# Patient Record
Sex: Male | Born: 1970 | Race: Black or African American | Hispanic: No | Marital: Single | Smoking: Current every day smoker
Health system: Southern US, Community
[De-identification: ages and names within clinical notes are randomized; demographics above are authoritative.]

## PROBLEM LIST (undated history)

## (undated) DIAGNOSIS — F250 Schizoaffective disorder, bipolar type: Secondary | ICD-10-CM

## (undated) DIAGNOSIS — F419 Anxiety disorder, unspecified: Secondary | ICD-10-CM

## (undated) DIAGNOSIS — F259 Schizoaffective disorder, unspecified: Secondary | ICD-10-CM

## (undated) DIAGNOSIS — F1410 Cocaine abuse, uncomplicated: Secondary | ICD-10-CM

## (undated) DIAGNOSIS — F319 Bipolar disorder, unspecified: Secondary | ICD-10-CM

## (undated) DIAGNOSIS — F1210 Cannabis abuse, uncomplicated: Secondary | ICD-10-CM

## (undated) HISTORY — PX: HERNIA REPAIR: SHX51

---

## 2012-07-01 ENCOUNTER — Emergency Department: Payer: Self-pay | Admitting: Internal Medicine

## 2012-07-01 LAB — ETHANOL
Ethanol %: <0.003 % (ref 0.000–0.080)
Ethanol: <3 mg/dL

## 2012-07-01 LAB — URINALYSIS, COMPLETE
Bilirubin,UR: NEGATIVE
Glucose,UR: NEGATIVE mg/dL (ref 0–75)
Leukocyte Esterase: NEGATIVE
Nitrite: NEGATIVE
Ph: 6 (ref 4.5–8.0)
WBC UR: 1 /HPF (ref 0–5)

## 2012-07-01 LAB — COMPREHENSIVE METABOLIC PANEL
Albumin: 3.8 g/dL (ref 3.4–5.0)
Alkaline Phosphatase: 74 U/L (ref 50–136)
BUN: 9 mg/dL (ref 7–18)
Bilirubin,Total: 0.4 mg/dL (ref 0.2–1.0)
Calcium, Total: 8.8 mg/dL (ref 8.5–10.1)
Chloride: 108 mmol/L — ABNORMAL HIGH (ref 98–107)
Co2: 23 mmol/L (ref 21–32)
Creatinine: 0.85 mg/dL (ref 0.60–1.30)
EGFR (African American): >60
EGFR (Non-African Amer.): >60
Glucose: 88 mg/dL (ref 65–99)
Potassium: 4.1 mmol/L (ref 3.5–5.1)
SGOT(AST): 21 U/L (ref 15–37)
SGPT (ALT): 21 U/L (ref 12–78)
Sodium: 139 mmol/L (ref 136–145)
Total Protein: 7.4 g/dL (ref 6.4–8.2)

## 2012-07-01 LAB — CBC
HGB: 13.2 g/dL (ref 13.0–18.0)
MCV: 85 fL (ref 80–100)
Platelet: 151 10*3/uL (ref 150–440)
RBC: 4.65 10*6/uL (ref 4.40–5.90)

## 2012-07-01 LAB — DRUG SCREEN, URINE
Benzodiazepine, Ur Scrn: NEGATIVE (ref ?–200)
Cannabinoid 50 Ng, Ur ~~LOC~~: NEGATIVE (ref ?–50)
MDMA (Ecstasy)Ur Screen: NEGATIVE (ref ?–500)
Opiate, Ur Screen: NEGATIVE (ref ?–300)

## 2012-07-01 LAB — ACETAMINOPHEN LEVEL: Acetaminophen: <2 ug/mL

## 2013-05-09 ENCOUNTER — Emergency Department: Payer: Self-pay | Admitting: Emergency Medicine

## 2013-05-09 LAB — URINALYSIS, COMPLETE
Bacteria: NONE SEEN
Bilirubin,UR: NEGATIVE
Blood: NEGATIVE
Glucose,UR: NEGATIVE mg/dL (ref 0–75)
Hyaline Cast: 1
KETONE: NEGATIVE
Leukocyte Esterase: NEGATIVE
Nitrite: NEGATIVE
PROTEIN: NEGATIVE
Ph: 7 (ref 4.5–8.0)
Specific Gravity: 1.018 (ref 1.003–1.030)
Squamous Epithelial: <1
WBC UR: <1 /HPF (ref 0–5)

## 2013-05-09 LAB — CBC
HCT: 41 % (ref 40.0–52.0)
HGB: 13.3 g/dL (ref 13.0–18.0)
MCH: 28 pg (ref 26.0–34.0)
MCHC: 32.5 g/dL (ref 32.0–36.0)
MCV: 86 fL (ref 80–100)
Platelet: 161 10*3/uL (ref 150–440)
RBC: 4.77 10*6/uL (ref 4.40–5.90)
RDW: 13.4 % (ref 11.5–14.5)
WBC: 6.4 10*3/uL (ref 3.8–10.6)

## 2013-05-09 LAB — COMPREHENSIVE METABOLIC PANEL
ALT: 21 U/L (ref 12–78)
AST: 15 U/L (ref 15–37)
Albumin: 3.7 g/dL (ref 3.4–5.0)
Alkaline Phosphatase: 81 U/L
Anion Gap: 5 — ABNORMAL LOW (ref 7–16)
BUN: 9 mg/dL (ref 7–18)
Bilirubin,Total: 0.4 mg/dL (ref 0.2–1.0)
CALCIUM: 8.8 mg/dL (ref 8.5–10.1)
CHLORIDE: 111 mmol/L — AB (ref 98–107)
Co2: 24 mmol/L (ref 21–32)
Creatinine: 0.84 mg/dL (ref 0.60–1.30)
EGFR (African American): >60
EGFR (Non-African Amer.): >60
GLUCOSE: 84 mg/dL (ref 65–99)
OSMOLALITY: 277 (ref 275–301)
Potassium: 3.9 mmol/L (ref 3.5–5.1)
SODIUM: 140 mmol/L (ref 136–145)
TOTAL PROTEIN: 7.1 g/dL (ref 6.4–8.2)

## 2013-05-09 LAB — ETHANOL: Ethanol %: <0.003 % (ref 0.000–0.080)

## 2013-05-09 LAB — DRUG SCREEN, URINE
Amphetamines, Ur Screen: NEGATIVE (ref ?–1000)
BENZODIAZEPINE, UR SCRN: NEGATIVE (ref ?–200)
Barbiturates, Ur Screen: NEGATIVE (ref ?–200)
CANNABINOID 50 NG, UR ~~LOC~~: NEGATIVE (ref ?–50)
COCAINE METABOLITE, UR ~~LOC~~: NEGATIVE (ref ?–300)
MDMA (Ecstasy)Ur Screen: NEGATIVE (ref ?–500)
Methadone, Ur Screen: NEGATIVE (ref ?–300)
Opiate, Ur Screen: NEGATIVE (ref ?–300)
PHENCYCLIDINE (PCP) UR S: NEGATIVE (ref ?–25)
Tricyclic, Ur Screen: NEGATIVE (ref ?–1000)

## 2013-05-09 LAB — ACETAMINOPHEN LEVEL: Acetaminophen: <2 ug/mL

## 2013-05-09 LAB — SALICYLATE LEVEL: Salicylates, Serum: 3.4 mg/dL — ABNORMAL HIGH

## 2013-05-09 LAB — VALPROIC ACID LEVEL: Valproic Acid: 9 ug/mL — ABNORMAL LOW

## 2014-05-16 NOTE — Consult Note (Signed)
Chief Complaint:  Chief Complaint Mr. Kevin Dunlap called police when per Ms. 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   He usually tries to call to get in the hospital  and has to be watched to prevent that.  He reports to me that he needs to go to the hospital but can't say why.   Presenting Symptoms:  Presenting Symptoms Apathy/Lethargy   Additional Symptoms Mr. Kevin Dunlap is avoidant on interview, Keeping a blanket over his head.  He is not spontaneous and is difficult to understand answering with one word or not answering at all.  He denies suicidal or homocidal ideation intent or plan.   History of Present Illness:  History of Present Illness He denies, says he doesn't want to talk aboutit but does report that he wants to go back to [place]. No family involvement.  DSS involvment. Called police told them he was angry and needed to go to the hospital.   Precipitating Event & Recent Stressors:  Precipitating Event & Recent Stressors Financial Problems  Employment Problems   Precipitating Event & Recent Stressors See above.  Tells me he needs to go to [facility] and get his Invega Injection.  Last injection Invega Sustenna 234 mg IM 06/07/2012. Next due o6/15/2014.   Target Symptoms:  Manic Impaired Judgment  Impulsivity   Anxiety Phobias   Arousal/Cognitive Agitation   Past Psychiatric Treatment: First Treatment: Does not answer.   Previous Hospitalizations: Does not answer.   Current [facility] [facility], [place]   Substance Abuse Treatment History: Does not answer.   Current Psychotropic Medications: Invega Sustenna 234 mg IM (last dose 06/07/2012, next due 07/08/2012.  Substance Abuse- Tobacco Use: Tobacco Use: Yes.  Past Medical & Surgical History:  Past Medical/Surgical Hx Does not answer   Past Medical/Surgical Hx Does not answer.   Family History: The patient denies any history of mental illness in the  family.. Does not answer.  Social History: Denies family involvment.  History of Trauma or Abuse: The patient denies any history of previous physical or sexual abuse.. No answer.  Legal History: The patient denies any history of arrests or previous incarcerations.Gustavus Bryant. [place].  Mental Status Exam:  Speech Monotone  Soft   Affect Constricted   Orientation Self  Place   Concentration Can't judge   Judgement Poor   Insight Poor   Reliabiity Poor   Suicide Risk Assessment: Suicide Risk Level No risk inidicated.  Electronic Signatures: Maryan PulsGreason, Dannis Deroche C (MD)  (Signed 09-Jun-14 16:12)  Authored: Chief Complaint, Presenting Symptoms, History of Present Illness, Precipitating Event & Recent Stressors, Target Symptoms, Past Psychiatric Treatment, Substance Abuse History, Past Medical & Surgical History, Family History, Social History, History of Trauma or Abuse, Legal History, Mental Status Exam, Suicide Risk Assessment   Last Updated: 09-Jun-14 16:12 by Maryan PulsGreason, Kie Calvin C (MD)

## 2014-05-16 NOTE — Consult Note (Signed)
Brief Consult Note: Diagnosis: Schizophrenia, PT.   Patient was seen by consultant.   Recommend further assessment or treatment.   Comments: [facility]. He stated that he is doing better now and wants to be discharged back to his group home. He follows Dr Omelia BlackwaterHeaden and he will get his Hinda Glatternvega shot on the 6/15. He is not having any perceptual disturbances and he denied SI/HI or plans. He was able to participate well in the interview. Has no acute issues at this time.   Plan: Pt will be discharged in stable condition.  He has supply of meds and no d/c meds will be given.  Follow up with Dr Omelia BlackwaterHeaden.  Electronic Signatures: Rhunette CroftFaheem, Chadwick Reiswig S (MD)  (Signed 10-Jun-14 10:35)  Authored: Brief Consult Note   Last Updated: 10-Jun-14 10:35 by Rhunette CroftFaheem, Prince Couey S (MD)

## 2014-07-27 ENCOUNTER — Encounter: Payer: Self-pay | Admitting: Emergency Medicine

## 2014-07-27 ENCOUNTER — Emergency Department
Admission: EM | Admit: 2014-07-27 | Discharge: 2014-07-27 | Disposition: A | Discharge status: Home / Self Care | Payer: Medicaid Other | Attending: Emergency Medicine | Admitting: Emergency Medicine

## 2014-07-27 DIAGNOSIS — Y998 Other external cause status: Secondary | ICD-10-CM | POA: Diagnosis not present

## 2014-07-27 DIAGNOSIS — Y92410 Unspecified street and highway as the place of occurrence of the external cause: Secondary | ICD-10-CM | POA: Diagnosis not present

## 2014-07-27 DIAGNOSIS — G8929 Other chronic pain: Secondary | ICD-10-CM | POA: Insufficient documentation

## 2014-07-27 DIAGNOSIS — Z041 Encounter for examination and observation following transport accident: Secondary | ICD-10-CM | POA: Insufficient documentation

## 2014-07-27 DIAGNOSIS — Y9389 Activity, other specified: Secondary | ICD-10-CM | POA: Insufficient documentation

## 2014-07-27 DIAGNOSIS — Z720 Tobacco use: Secondary | ICD-10-CM | POA: Diagnosis not present

## 2014-07-27 HISTORY — DX: Schizoaffective disorder, unspecified: F25.9

## 2014-07-27 HISTORY — DX: Bipolar disorder, unspecified: F31.9

## 2014-07-27 HISTORY — DX: Anxiety disorder, unspecified: F41.9

## 2014-07-27 HISTORY — DX: Schizoaffective disorder, bipolar type: F25.0
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
## 2015-06-21 ENCOUNTER — Emergency Department: Payer: Medicaid Other

## 2015-06-21 ENCOUNTER — Emergency Department
Admission: EM | Admit: 2015-06-21 | Discharge: 2015-06-21 | Disposition: A | Discharge status: Home / Self Care | Payer: Medicaid Other | Attending: Emergency Medicine | Admitting: Emergency Medicine

## 2015-06-21 DIAGNOSIS — Y939 Activity, unspecified: Secondary | ICD-10-CM | POA: Insufficient documentation

## 2015-06-21 DIAGNOSIS — F17210 Nicotine dependence, cigarettes, uncomplicated: Secondary | ICD-10-CM | POA: Diagnosis not present

## 2015-06-21 DIAGNOSIS — W1839XA Other fall on same level, initial encounter: Secondary | ICD-10-CM | POA: Insufficient documentation

## 2015-06-21 DIAGNOSIS — F319 Bipolar disorder, unspecified: Secondary | ICD-10-CM | POA: Diagnosis not present

## 2015-06-21 DIAGNOSIS — S56902A Unspecified injury of unspecified muscles, fascia and tendons at forearm level, left arm, initial encounter: Secondary | ICD-10-CM | POA: Diagnosis present

## 2015-06-21 DIAGNOSIS — S42202A Unspecified fracture of upper end of left humerus, initial encounter for closed fracture: Secondary | ICD-10-CM | POA: Insufficient documentation

## 2015-06-21 DIAGNOSIS — F259 Schizoaffective disorder, unspecified: Secondary | ICD-10-CM | POA: Insufficient documentation

## 2015-06-21 DIAGNOSIS — Y999 Unspecified external cause status: Secondary | ICD-10-CM | POA: Diagnosis not present

## 2015-06-21 DIAGNOSIS — Y92091 Bathroom in other non-institutional residence as the place of occurrence of the external cause: Secondary | ICD-10-CM | POA: Diagnosis not present

## 2015-06-21 DIAGNOSIS — S42302A Unspecified fracture of shaft of humerus, left arm, initial encounter for closed fracture: Secondary | ICD-10-CM

## 2015-06-21 MED ORDER — ONDANSETRON HCL 4 MG/2ML IJ SOLN
INTRAMUSCULAR | Status: AC
Start: 2015-06-21 — End: 2015-06-21
  Administered 2015-06-21: 4 mg via INTRAVENOUS
  Filled 2015-06-21: qty 2

## 2015-06-21 MED ORDER — MORPHINE SULFATE (PF) 4 MG/ML IV SOLN
4.0000 mg | Freq: Once | INTRAVENOUS | Status: AC
  Administered 2015-06-21: 4 mg via INTRAVENOUS

## 2015-06-21 MED ORDER — HYDROMORPHONE HCL 1 MG/ML IJ SOLN
INTRAMUSCULAR | Status: AC
  Administered 2015-06-21: 1 mg via INTRAVENOUS
  Filled 2015-06-21: qty 1

## 2015-06-21 MED ORDER — HYDROMORPHONE HCL 1 MG/ML IJ SOLN
1.0000 mg | Freq: Once | INTRAMUSCULAR | Status: AC
  Administered 2015-06-21: 1 mg via INTRAVENOUS

## 2015-06-21 MED ORDER — ONDANSETRON HCL 4 MG/2ML IJ SOLN
4.0000 mg | Freq: Once | INTRAMUSCULAR | Status: AC
  Administered 2015-06-21: 4 mg via INTRAVENOUS

## 2015-06-21 MED ORDER — IBUPROFEN 800 MG PO TABS: 800.0000 mg | ORAL_TABLET | Freq: Three times a day (TID) | ORAL | Status: AC | PRN

## 2015-06-21 MED ORDER — MORPHINE SULFATE (PF) 4 MG/ML IV SOLN
INTRAVENOUS | Status: AC
  Administered 2015-06-21: 4 mg via INTRAVENOUS
  Filled 2015-06-21: qty 1

## 2015-06-21 MED ORDER — OXYCODONE-ACETAMINOPHEN 10-325 MG PO TABS: 1.0000 | ORAL_TABLET | ORAL | Status: DC | PRN
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
## 2015-06-21 NOTE — ED Notes (Signed)
Discharge instructions reviewed with patient and representative from group home. Patient and representative from group home verbalized understanding. Patient taken to lobby via wheelchair by ED tech.
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
## 2015-06-21 NOTE — Discharge Instructions (Signed)
1. Take pain medicines as needed (Motrin/Percocet). 2. Keep splint clean & dry. Wear sling as needed for comfort. 3. Return to the ER for worsening symptoms, numbness/tingling, increased swelling, discoloration of fingers or other concerns.  Humerus Fracture Treated With Immobilization The humerus is the large bone in your upper arm. You have a broken (fractured) humerus. These fractures are easily diagnosed with X-rays. TREATMENT  Simple fractures which will heal without disability are treated with simple immobilization. Immobilization means you will wear a cast, splint, or sling. You have a fracture which will do well with immobilization. The fracture will heal well simply by being held in a good position until it is stable enough to begin range of motion exercises. Do not take part in activities which would further injure your arm.  HOME CARE INSTRUCTIONS   Put ice on the injured area.  Put ice in a plastic bag.  Place a towel between your skin and the bag.  Leave the ice on for 15-20 minutes, 03-04 times a day.  If you have a cast:  Do not scratch the skin under the cast using sharp or pointed objects.  Check the skin around the cast every day. You may put lotion on any red or sore areas.  Keep your cast dry and clean.  If you have a splint:  Wear the splint as directed.  Keep your splint dry and clean.  You may loosen the elastic around the splint if your fingers become numb, tingle, or turn cold or blue.  If you have a sling:  Wear the sling as directed.  Do not put pressure on any part of your cast or splint until it is fully hardened.  Your cast or splint can be protected during bathing with a plastic bag. Do not lower the cast or splint into water.  Only take over-the-counter or prescription medicines for pain, discomfort, or fever as directed by your caregiver.  Do range of motion exercises as instructed by your caregiver.  Follow up as directed by your  caregiver. This is very important in order to avoid permanent injury or disability and chronic pain. SEEK IMMEDIATE MEDICAL CARE IF:   Your skin or nails in the injured arm turn blue or gray.  Your arm feels cold or numb.  You develop severe pain in the injured arm.  You are having problems with the medicines you were given. MAKE SURE YOU:   Understand these instructions.  Will watch your condition.  Will get help right away if you are not doing well or get worse.   This information is not intended to replace advice given to you by your health care provider. Make sure you discuss any questions you have with your health care provider.   Document Released: 04/18/2000 Document Revised: 01/31/2014 Document Reviewed: 06/04/2014 Elsevier Interactive Patient Education 2016 [employer]  Cast or Splint Care Casts and splints support injured limbs and keep bones from moving while they heal. It is important to care for your cast or splint at home.  HOME CARE INSTRUCTIONS  Keep the cast or splint uncovered during the drying period. It can take 24 to 48 hours to dry if it is made of plaster. A fiberglass cast will dry in less than 1 hour.  Do not rest the cast on anything harder than a pillow for the first 24 hours.  Do not put weight on your injured limb or apply pressure to the cast until your health care provider gives you permission.  Keep the cast or splint dry. Wet casts or splints can lose their shape and may not support the limb as well. A wet cast that has lost its shape can also create harmful pressure on your skin when it dries. Also, wet skin can become infected.  Cover the cast or splint with a plastic bag when bathing or when out in the rain or snow. If the cast is on the trunk of the body, take sponge baths until the cast is removed.  If your cast does become wet, dry it with a towel or a blow dryer on the cool setting only.  Keep your cast or splint clean. Soiled casts  may be wiped with a moistened cloth.  Do not place any hard or soft foreign objects under your cast or splint, such as cotton, toilet paper, lotion, or powder.  Do not try to scratch the skin under the cast with any object. The object could get stuck inside the cast. Also, scratching could lead to an infection. If itching is a problem, use a blow dryer on a cool setting to relieve discomfort.  Do not trim or cut your cast or remove padding from inside of it.  Exercise all joints next to the injury that are not immobilized by the cast or splint. For example, if you have a long leg cast, exercise the hip joint and toes. If you have an arm cast or splint, exercise the shoulder, elbow, thumb, and fingers.  Elevate your injured arm or leg on 1 or 2 pillows for the first 1 to 3 days to decrease swelling and pain.It is best if you can comfortably elevate your cast so it is higher than your heart. SEEK MEDICAL CARE IF:   Your cast or splint cracks.  Your cast or splint is too tight or too loose.  You have unbearable itching inside the cast.  Your cast becomes wet or develops a soft spot or area.  You have a bad smell coming from inside your cast.  You get an object stuck under your cast.  Your skin around the cast becomes red or raw.  You have new pain or worsening pain after the cast has been applied. SEEK IMMEDIATE MEDICAL CARE IF:   You have fluid leaking through the cast.  You are unable to move your fingers or toes.  You have discolored (blue or white), cool, painful, or very swollen fingers or toes beyond the cast.  You have tingling or numbness around the injured area.  You have severe pain or pressure under the cast.  You have any difficulty with your breathing or have shortness of breath.  You have chest pain.   This information is not intended to replace advice given to you by your health care provider. Make sure you discuss any questions you have with your health care  provider.   Document Released: 01/08/2000 Document Revised: 10/31/2012 Document Reviewed: 07/19/2012 Elsevier Interactive Patient Education [employer]

## 2015-06-29 ENCOUNTER — Other Ambulatory Visit: Payer: Medicaid Other

## 2015-06-29 ENCOUNTER — Encounter: Payer: Self-pay | Admitting: *Deleted

## 2015-06-29 NOTE — Patient Instructions (Signed)
  Your procedure is scheduled on: 06-30-15 Report to [facility] SAME [facility] 2ND FLOOR @ 9:30 AM-Kevin Dunlap NOTIFIED OF TIME DURING PHONE INTERVIEW   Remember: Instructions that are not followed completely may result in serious medical risk, up to and including death, or upon the discretion of your surgeon and anesthesiologist your surgery may need to be rescheduled.    _X___ 1. Do not eat food or drink liquids after midnight. No gum chewing or hard candies.     ____ 2. No Alcohol for 24 hours before or after surgery.   ____ 3. Bring all medications with you on the day of surgery if instructed.    _X___ 4. Notify your doctor if there is any change in your medical condition     (cold, fever, infections).     Do not wear jewelry, make-up, hairpins, clips or nail polish.  Do not wear lotions, powders, or perfumes. You may wear deodorant.  Do not shave 48 hours prior to surgery. Men may shave face and neck.  Do not bring valuables to the hospital.    [facility] belongings or valuables.               Contacts, dentures or bridgework may not be worn into surgery.  Leave your suitcase in the car. After surgery it may be brought to your room.  For patients admitted to the hospital, discharge time is determined by your treatment team.   Patients discharged the day of surgery will not be allowed to drive home.   Please read over the following fact sheets that you were given:     _X___ Take these medicines the morning of surgery with A SIP OF WATER:    1. DEPAKOTE  2. FLUPHENAZINE  3. MAY TAKE PERCOCET AM OF SURGERY IF NEEDED  4.  5.  6.  ____ Fleet Enema (as directed)   ____ Use CHG Soap as directed  ____ Use inhalers on the day of surgery  ____ Stop metformin 2 days prior to surgery    ____ Take 1/2 of usual insulin dose the night before surgery and none on the morning of surgery.   ____ Stop Coumadin/Plavix/aspirin-N/A  _X___ Stop  Anti-inflammatories-STOP IBUPROFEN NOW-PERCOCET OK TO CONTINUE   ____ Stop supplements until after surgery.    ____ Bring C-Pap to the hospital.

## 2015-06-30 ENCOUNTER — Ambulatory Visit: Payer: Medicaid Other

## 2015-06-30 ENCOUNTER — Encounter
Admission: RE | Disposition: A | Discharge status: Home / Self Care | Payer: Self-pay | Source: Ambulatory Visit | Attending: Surgery

## 2015-06-30 ENCOUNTER — Encounter: Payer: Self-pay | Admitting: Anesthesiology

## 2015-06-30 ENCOUNTER — Ambulatory Visit: Payer: Medicaid Other | Admitting: Anesthesiology

## 2015-06-30 ENCOUNTER — Ambulatory Visit
Admission: RE | Admit: 2015-06-30 | Discharge: 2015-06-30 | Disposition: A | Discharge status: Home / Self Care | Payer: Medicaid Other | Source: Ambulatory Visit | Attending: Surgery | Admitting: Surgery

## 2015-06-30 DIAGNOSIS — J45909 Unspecified asthma, uncomplicated: Secondary | ICD-10-CM | POA: Diagnosis not present

## 2015-06-30 DIAGNOSIS — S42352A Displaced comminuted fracture of shaft of humerus, left arm, initial encounter for closed fracture: Secondary | ICD-10-CM | POA: Diagnosis not present

## 2015-06-30 DIAGNOSIS — F319 Bipolar disorder, unspecified: Secondary | ICD-10-CM | POA: Diagnosis not present

## 2015-06-30 DIAGNOSIS — F259 Schizoaffective disorder, unspecified: Secondary | ICD-10-CM | POA: Insufficient documentation

## 2015-06-30 DIAGNOSIS — G40909 Epilepsy, unspecified, not intractable, without status epilepticus: Secondary | ICD-10-CM | POA: Diagnosis not present

## 2015-06-30 DIAGNOSIS — Z8249 Family history of ischemic heart disease and other diseases of the circulatory system: Secondary | ICD-10-CM | POA: Diagnosis not present

## 2015-06-30 DIAGNOSIS — F419 Anxiety disorder, unspecified: Secondary | ICD-10-CM | POA: Diagnosis not present

## 2015-06-30 DIAGNOSIS — F1410 Cocaine abuse, uncomplicated: Secondary | ICD-10-CM | POA: Diagnosis not present

## 2015-06-30 DIAGNOSIS — F17210 Nicotine dependence, cigarettes, uncomplicated: Secondary | ICD-10-CM | POA: Insufficient documentation

## 2015-06-30 DIAGNOSIS — Z419 Encounter for procedure for purposes other than remedying health state, unspecified: Secondary | ICD-10-CM

## 2015-06-30 DIAGNOSIS — Z79899 Other long term (current) drug therapy: Secondary | ICD-10-CM | POA: Diagnosis not present

## 2015-06-30 DIAGNOSIS — F1210 Cannabis abuse, uncomplicated: Secondary | ICD-10-CM | POA: Insufficient documentation

## 2015-06-30 DIAGNOSIS — Z833 Family history of diabetes mellitus: Secondary | ICD-10-CM | POA: Insufficient documentation

## 2015-06-30 HISTORY — DX: Cocaine abuse, uncomplicated: F14.10

## 2015-06-30 HISTORY — DX: Cannabis abuse, uncomplicated: F12.10

## 2015-06-30 HISTORY — PX: HUMERUS IM NAIL: SHX1769

## 2015-06-30 LAB — URINE DRUG SCREEN, QUALITATIVE (ARMC ONLY)
AMPHETAMINES, UR SCREEN: NOT DETECTED
BENZODIAZEPINE, UR SCRN: NOT DETECTED
Barbiturates, Ur Screen: NOT DETECTED
Cannabinoid 50 Ng, Ur ~~LOC~~: NOT DETECTED
Cocaine Metabolite,Ur ~~LOC~~: NOT DETECTED
MDMA (ECSTASY) UR SCREEN: NOT DETECTED
METHADONE SCREEN, URINE: NOT DETECTED
Opiate, Ur Screen: NOT DETECTED
PHENCYCLIDINE (PCP) UR S: NOT DETECTED
TRICYCLIC, UR SCREEN: NOT DETECTED

## 2015-06-30 SURGERY — INSERTION, INTRAMEDULLARY ROD, HUMERUS
Anesthesia: General | Site: Arm Upper | Laterality: Left | Wound class: Clean

## 2015-06-30 MED ORDER — CEFAZOLIN SODIUM-DEXTROSE 2-4 GM/100ML-% IV SOLN
2.0000 g | Freq: Once | INTRAVENOUS | Status: AC
  Administered 2015-06-30: 2 g via INTRAVENOUS

## 2015-06-30 MED ORDER — BUPIVACAINE-EPINEPHRINE (PF) 0.5% -1:200000 IJ SOLN
INTRAMUSCULAR | Status: AC
  Filled 2015-06-30: qty 30

## 2015-06-30 MED ORDER — FENTANYL CITRATE (PF) 100 MCG/2ML IJ SOLN
25.0000 ug | INTRAMUSCULAR | Status: DC | PRN
  Administered 2015-06-30: 25 ug via INTRAVENOUS
  Administered 2015-06-30: 50 ug via INTRAVENOUS
  Administered 2015-06-30 (×3): 25 ug via INTRAVENOUS

## 2015-06-30 MED ORDER — NEOMYCIN-POLYMYXIN B GU 40-200000 IR SOLN
Status: AC
  Filled 2015-06-30: qty 4

## 2015-06-30 MED ORDER — NEOSTIGMINE METHYLSULFATE 10 MG/10ML IV SOLN
INTRAVENOUS | Status: DC | PRN
  Administered 2015-06-30: 2 mg via INTRAVENOUS

## 2015-06-30 MED ORDER — OXYCODONE HCL 5 MG PO TABS: 5.0000 mg | ORAL_TABLET | Freq: Once | ORAL | Status: DC | PRN

## 2015-06-30 MED ORDER — KETOROLAC TROMETHAMINE 30 MG/ML IJ SOLN
INTRAMUSCULAR | Status: DC | PRN
  Administered 2015-06-30: 30 mg via INTRAVENOUS

## 2015-06-30 MED ORDER — FENTANYL CITRATE (PF) 100 MCG/2ML IJ SOLN
INTRAMUSCULAR | Status: AC
  Administered 2015-06-30: 25 ug via INTRAVENOUS
  Filled 2015-06-30: qty 2

## 2015-06-30 MED ORDER — LIDOCAINE HCL (CARDIAC) 20 MG/ML IV SOLN
INTRAVENOUS | Status: DC | PRN
  Administered 2015-06-30: 100 mg via INTRAVENOUS

## 2015-06-30 MED ORDER — ONDANSETRON HCL 4 MG/2ML IJ SOLN
4.0000 mg | Freq: Once | INTRAMUSCULAR | Status: AC
  Administered 2015-06-30: 4 mg via INTRAVENOUS

## 2015-06-30 MED ORDER — ROCURONIUM BROMIDE 100 MG/10ML IV SOLN
INTRAVENOUS | Status: DC | PRN
  Administered 2015-06-30: 50 mg via INTRAVENOUS

## 2015-06-30 MED ORDER — LACTATED RINGERS IV SOLN
INTRAVENOUS | Status: DC
  Administered 2015-06-30: 11:00:00 via INTRAVENOUS

## 2015-06-30 MED ORDER — ONDANSETRON HCL 4 MG/2ML IJ SOLN
INTRAMUSCULAR | Status: DC | PRN
  Administered 2015-06-30: 4 mg via INTRAVENOUS

## 2015-06-30 MED ORDER — OXYCODONE HCL 5 MG PO TABS: 5.0000 mg | ORAL_TABLET | ORAL | Status: DC | PRN

## 2015-06-30 MED ORDER — ONDANSETRON HCL 4 MG/2ML IJ SOLN
INTRAMUSCULAR | Status: AC
  Administered 2015-06-30: 4 mg via INTRAVENOUS
  Filled 2015-06-30: qty 2

## 2015-06-30 MED ORDER — OXYCODONE HCL 5 MG/5ML PO SOLN: 5.0000 mg | Freq: Once | ORAL | Status: DC | PRN

## 2015-06-30 MED ORDER — PROPOFOL 10 MG/ML IV BOLUS
INTRAVENOUS | Status: DC | PRN
Start: 2015-06-30 — End: 2015-06-30
  Administered 2015-06-30: 150 mg via INTRAVENOUS

## 2015-06-30 MED ORDER — FAMOTIDINE 20 MG PO TABS
20.0000 mg | ORAL_TABLET | Freq: Once | ORAL | Status: AC
  Administered 2015-06-30: 20 mg via ORAL

## 2015-06-30 MED ORDER — HYDROMORPHONE HCL 1 MG/ML IJ SOLN
INTRAMUSCULAR | Status: AC
  Administered 2015-06-30: 0.5 mg via INTRAVENOUS
  Filled 2015-06-30: qty 1

## 2015-06-30 MED ORDER — HYDROMORPHONE HCL 1 MG/ML IJ SOLN
0.5000 mg | INTRAMUSCULAR | Status: DC | PRN
  Administered 2015-06-30 (×2): 0.5 mg via INTRAVENOUS

## 2015-06-30 MED ORDER — FENTANYL CITRATE (PF) 100 MCG/2ML IJ SOLN
INTRAMUSCULAR | Status: AC
  Administered 2015-06-30: 50 ug via INTRAVENOUS
  Filled 2015-06-30: qty 2

## 2015-06-30 MED ORDER — CEFAZOLIN SODIUM-DEXTROSE 2-4 GM/100ML-% IV SOLN
INTRAVENOUS | Status: AC
  Filled 2015-06-30: qty 100

## 2015-06-30 MED ORDER — DEXAMETHASONE SODIUM PHOSPHATE 10 MG/ML IJ SOLN
INTRAMUSCULAR | Status: DC | PRN
  Administered 2015-06-30: 10 mg via INTRAVENOUS

## 2015-06-30 MED ORDER — FAMOTIDINE 20 MG PO TABS
ORAL_TABLET | ORAL | Status: AC
  Administered 2015-06-30: 20 mg via ORAL
  Filled 2015-06-30: qty 1

## 2015-06-30 MED ORDER — SODIUM CHLORIDE 0.9 % IR SOLN
Status: DC | PRN
  Administered 2015-06-30: 1000 mL

## 2015-06-30 MED ORDER — FENTANYL CITRATE (PF) 100 MCG/2ML IJ SOLN
INTRAMUSCULAR | Status: DC | PRN
  Administered 2015-06-30: 25 ug via INTRAVENOUS
  Administered 2015-06-30: 50 ug via INTRAVENOUS
  Administered 2015-06-30: 25 ug via INTRAVENOUS
  Administered 2015-06-30 (×2): 100 ug via INTRAVENOUS

## 2015-06-30 MED ORDER — GLYCOPYRROLATE 0.2 MG/ML IJ SOLN
INTRAMUSCULAR | Status: DC | PRN
  Administered 2015-06-30: 0.3 mg via INTRAVENOUS
  Administered 2015-06-30 (×2): 0.1 mg via INTRAVENOUS

## 2015-06-30 MED ORDER — BUPIVACAINE-EPINEPHRINE (PF) 0.5% -1:200000 IJ SOLN
INTRAMUSCULAR | Status: DC | PRN
  Administered 2015-06-30: 30 mL via PERINEURAL

## 2015-06-30 SURGICAL SUPPLY — 60 items
BANDAGE ACE 4X5 VEL STRL LF (GAUZE/BANDAGES/DRESSINGS) ×9 IMPLANT
BIT DRILL 2.9 SHORT NS (BIT) ×1
BIT DRILL 2.9MM SHORT NS (BIT) ×1 IMPLANT
BIT DRILL 3.8 (BIT) ×2
BIT DRILL 3.8XNS DISP GRN (BIT) ×1 IMPLANT
BIT DRL 3.8XNS DISP GRN (BIT) ×1
BNDG PLASTER FAST 4X5 WHT LF (CAST SUPPLIES) ×9 IMPLANT
CANISTER SUCT 1200ML W/VALVE (MISCELLANEOUS) ×3 IMPLANT
CHLORAPREP W/TINT 26ML (MISCELLANEOUS) ×3 IMPLANT
CLOSURE WOUND 1/2 X4 (GAUZE/BANDAGES/DRESSINGS) ×1
COOLER POLAR GLACIER W/PUMP (MISCELLANEOUS) ×3 IMPLANT
CRADLE LAMINECT ARM (MISCELLANEOUS) ×3 IMPLANT
DRAPE C-ARM XRAY 36X54 (DRAPES) ×9 IMPLANT
DRAPE FLUOR MINI C-ARM 54X84 (DRAPES) IMPLANT
DRAPE IMP U-DRAPE 54X76 (DRAPES) ×6 IMPLANT
DRILL BIT 2.9MM SHORT NS (BIT) ×2
DRSG OPSITE POSTOP 3X4 (GAUZE/BANDAGES/DRESSINGS) ×3 IMPLANT
ELECT CAUTERY BLADE 6.4 (BLADE) ×3 IMPLANT
GAUZE PETRO XEROFOAM 1X8 (MISCELLANEOUS) ×3 IMPLANT
GAUZE SPONGE 4X4 12PLY STRL (GAUZE/BANDAGES/DRESSINGS) ×3 IMPLANT
GLOVE BIO SURGEON STRL SZ7 (GLOVE) ×6 IMPLANT
GLOVE BIO SURGEON STRL SZ8 (GLOVE) ×9 IMPLANT
GLOVE INDICATOR 8.0 STRL GRN (GLOVE) ×9 IMPLANT
GOWN STRL REUS W/ TWL LRG LVL3 (GOWN DISPOSABLE) ×3 IMPLANT
GOWN STRL REUS W/ TWL XL LVL3 (GOWN DISPOSABLE) ×1 IMPLANT
GOWN STRL REUS W/TWL LRG LVL3 (GOWN DISPOSABLE) ×6
GOWN STRL REUS W/TWL XL LVL3 (GOWN DISPOSABLE) ×2
GUIDEPIN 3.2X17.5 THRD DISP (PIN) ×3 IMPLANT
GUIDEWIRE BALL NOSE 2.0MM (WIRE) ×3 IMPLANT
GUIDEWIRE HUMERAL 2.2MMX711MM (WIRE) ×3 IMPLANT
HEMOVAC 400ML (MISCELLANEOUS)
IMMOBILIZER SHDR LG LX 900803 (SOFTGOODS) ×3 IMPLANT
IMMOBILIZER SHDR XL LX WHT (SOFTGOODS) ×3 IMPLANT
KIT DRAIN HEMOVAC JP 7FR 400ML (MISCELLANEOUS) IMPLANT
KIT RM TURNOVER STRD PROC AR (KITS) ×3 IMPLANT
NAIL HUMERAL 7X260MM (Nail) ×3 IMPLANT
NS IRRIG 1000ML POUR BTL (IV SOLUTION) ×3 IMPLANT
PACK ARTHROSCOPY SHOULDER (MISCELLANEOUS) ×3 IMPLANT
PAD CAST CTTN 4X4 STRL (SOFTGOODS) ×2 IMPLANT
PAD GROUND ADULT SPLIT (MISCELLANEOUS) ×3 IMPLANT
PAD WRAPON POLAR SHDR UNIV (MISCELLANEOUS) ×1 IMPLANT
PADDING CAST COTTON 4X4 STRL (SOFTGOODS) ×4
SCREW ACECAP 28MM (Screw) ×6 IMPLANT
SCREW ACECAP 32MM (Screw) IMPLANT
SCREW ACECAP 34MM (Screw) ×3 IMPLANT
SCREW CORT FT 32X3.5XNS HUM (Screw) ×1 IMPLANT
SCREW CORTICAL 3.5MM 32MM (Screw) ×2 IMPLANT
SCREWDRIVER HEX TIP 3.5MM (MISCELLANEOUS) ×3 IMPLANT
SLEEVE PROTECTION STRL DISP (MISCELLANEOUS) ×6 IMPLANT
STAPLER SKIN PROX 35W (STAPLE) ×3 IMPLANT
STRAP SAFETY BODY (MISCELLANEOUS) ×3 IMPLANT
STRIP CLOSURE SKIN 1/2X4 (GAUZE/BANDAGES/DRESSINGS) ×2 IMPLANT
SUT PROLENE 4 0 PS 2 18 (SUTURE) IMPLANT
SUT VIC AB 0 CT1 36 (SUTURE) IMPLANT
SUT VIC AB 2-0 CT1 27 (SUTURE)
SUT VIC AB 2-0 CT1 TAPERPNT 27 (SUTURE) IMPLANT
SUT VIC AB 2-0 CT2 27 (SUTURE) IMPLANT
TAPE MICROFOAM 4IN (TAPE) ×3 IMPLANT
TUBE EXCHANGE NAIL HUMERAL (TRAUMA) ×3 IMPLANT
WRAPON POLAR PAD SHDR UNIV (MISCELLANEOUS) ×3

## 2015-06-30 NOTE — Anesthesia Postprocedure Evaluation (Signed)
Anesthesia Post Note  Patient: Kevin Dunlap  Procedure(s) Performed: Procedure(s) (LRB): INTRAMEDULLARY (IM) NAIL HUMERAL (Left)  Patient location during evaluation: PACU Anesthesia Type: General Level of consciousness: awake and alert Pain management: pain level controlled Vital Signs Assessment: post-procedure vital signs reviewed and stable Respiratory status: spontaneous breathing, nonlabored ventilation, respiratory function stable and patient connected to nasal cannula oxygen Cardiovascular status: blood pressure returned to baseline and stable Postop Assessment: no signs of nausea or vomiting Anesthetic complications: no    Last Vitals:  Filed Vitals:   06/30/15 1527 06/30/15 1549  BP: 139/91 120/85  Pulse: 88 78  Temp: 36.1 C   Resp: 16     Last Pain:  Filed Vitals:   06/30/15 1556  PainSc: 5                  Aashir Umholtz K Cozy Veale

## 2015-06-30 NOTE — Discharge Instructions (Signed)
Keep splint dry and intact. Keep immobilizer on at all times. Apply ice to affected area frequently. Return for follow-up in 10-14 days or as scheduled.

## 2015-06-30 NOTE — H&P (Signed)
Paper H&P to be scanned into permanent record. H&P reviewed. No changes. 

## 2015-06-30 NOTE — Transfer of Care (Signed)
Immediate Anesthesia Transfer of Care Note  Patient: Kevin Dunlap  Procedure(s) Performed: Procedure(s): INTRAMEDULLARY (IM) NAIL HUMERAL (Left)  Patient Location: PACU  Anesthesia Type:General  Level of Consciousness: awake, alert , oriented and patient cooperative  Airway & Oxygen Therapy: Patient Spontanous Breathing and Patient connected to nasal cannula oxygen  Post-op Assessment: Report given to RN and Post -op Vital signs reviewed and stable  Post vital signs: Reviewed and stable  Last Vitals:  Filed Vitals:   06/30/15 0943 06/30/15 1402  BP: 126/86 155/95  Pulse: 91 111  Temp: 36.4 C 36.4 C  Resp: 16 21    Last Pain:  Filed Vitals:   06/30/15 1403  PainSc: 3          Complications: No apparent anesthesia complications

## 2015-06-30 NOTE — Anesthesia Procedure Notes (Signed)
Procedure Name: Intubation Date/Time: 06/30/2015 11:00 AM Performed by: Peyton NajjarSIMMONS, Kevin Wotton Pre-anesthesia Checklist: Patient identified, Patient being monitored, Timeout performed, Emergency Drugs available and Suction available Patient Re-evaluated:Patient Re-evaluated prior to inductionOxygen Delivery Method: Circle system utilized Preoxygenation: Pre-oxygenation with 100% oxygen Intubation Type: IV induction Ventilation: Mask ventilation without difficulty Laryngoscope Size: McGraph and 4 Grade View: Grade I Tube type: Oral Tube size: 7.0 mm Number of attempts: 1 Placement Confirmation: ETT inserted through vocal cords under direct vision,  positive ETCO2 and breath sounds checked- equal and bilateral Secured at: 23 cm Tube secured with: Tape Dental Injury: Teeth and Oropharynx as per pre-operative assessment

## 2015-06-30 NOTE — Progress Notes (Signed)
Circulation positive to left hand

## 2015-06-30 NOTE — Anesthesia Preprocedure Evaluation (Signed)
Anesthesia Evaluation  Patient identified by MRN, date of birth, ID band Patient awake    Reviewed: Allergy & Precautions, H&P , NPO status , Patient's Chart, lab work & pertinent test results  History of Anesthesia Complications Negative for: history of anesthetic complications  Airway Mallampati: II  TM Distance: >3 FB Neck ROM: full    Dental  (+) Poor Dentition, Chipped, Loose   Pulmonary neg shortness of breath, Current Smoker,    Pulmonary exam normal breath sounds clear to auscultation       Cardiovascular Exercise Tolerance: Good (-) angina(-) Past MI and (-) DOE negative cardio ROS Normal cardiovascular exam Rhythm:regular Rate:Normal     Neuro/Psych PSYCHIATRIC DISORDERS Anxiety Bipolar Disorder Schizophrenia negative neurological ROS     GI/Hepatic negative GI ROS, (+)     substance abuse  ,   Endo/Other  negative endocrine ROS  Renal/GU negative Renal ROS  negative genitourinary   Musculoskeletal   Abdominal   Peds  Hematology negative hematology ROS (+)   Anesthesia Other Findings Past Medical History:   Schizo-affective schizophrenia (HCC)                         Bipolar 1 disorder (HCC)                                     Anxiety                                                      Drug abuse, cocaine type                                     Drug abuse, marijuana                                       Past Surgical History:   HERNIA REPAIR                                                   Reproductive/Obstetrics negative OB ROS                             Anesthesia Physical Anesthesia Plan  ASA: III  Anesthesia Plan: General ETT   Post-op Pain Management:    Induction:   Airway Management Planned:   Additional Equipment:   Intra-op Plan:   Post-operative Plan:   Informed Consent: I have reviewed the patients History and Physical, chart, labs and  discussed the procedure including the risks, benefits and alternatives for the proposed anesthesia with the patient or authorized representative who has indicated his/her understanding and acceptance.   Dental Advisory Given  Plan Discussed with: Anesthesiologist, CRNA and Surgeon  Anesthesia Plan Comments:         Anesthesia Quick Evaluation
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
## 2015-07-01 ENCOUNTER — Encounter: Payer: Self-pay | Admitting: Surgery

## 2015-07-13 ENCOUNTER — Encounter: Payer: Self-pay | Admitting: Emergency Medicine

## 2015-07-13 DIAGNOSIS — R2232 Localized swelling, mass and lump, left upper limb: Secondary | ICD-10-CM | POA: Diagnosis present

## 2015-07-13 DIAGNOSIS — F1290 Cannabis use, unspecified, uncomplicated: Secondary | ICD-10-CM | POA: Diagnosis not present

## 2015-07-13 DIAGNOSIS — F319 Bipolar disorder, unspecified: Secondary | ICD-10-CM | POA: Diagnosis not present

## 2015-07-13 DIAGNOSIS — F259 Schizoaffective disorder, unspecified: Secondary | ICD-10-CM | POA: Diagnosis not present

## 2015-07-13 DIAGNOSIS — F17210 Nicotine dependence, cigarettes, uncomplicated: Secondary | ICD-10-CM | POA: Insufficient documentation

## 2015-07-13 DIAGNOSIS — Z79899 Other long term (current) drug therapy: Secondary | ICD-10-CM | POA: Insufficient documentation

## 2015-07-13 DIAGNOSIS — F1490 Cocaine use, unspecified, uncomplicated: Secondary | ICD-10-CM | POA: Diagnosis not present
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
## 2015-07-14 ENCOUNTER — Emergency Department
Admission: EM | Admit: 2015-07-14 | Discharge: 2015-07-14 | Disposition: A | Discharge status: Home / Self Care | Payer: Medicaid Other | Attending: Emergency Medicine | Admitting: Emergency Medicine

## 2015-07-14 ENCOUNTER — Emergency Department: Payer: Medicaid Other

## 2015-07-14 DIAGNOSIS — M7989 Other specified soft tissue disorders: Secondary | ICD-10-CM

## 2015-07-14 NOTE — ED Provider Notes (Signed)
[facility] Emergency Department Provider Note   ____________________________________________  Time seen: Approximately 2:17 AM  I have reviewed the triage vital signs and the nursing notes.   HISTORY  Chief Complaint Post-op Problem    HPI Kevin Dunlap is a 45 y.o. male who presents to the ED from group home with a chief complaint of left hand swelling. Patient most recently had surgery to repair left humerus fracture on 6/6. Has been in a brace since that time. He has been cleared by orthopedics to remove the brace to bathe but patient prefers to keep it on continuously. States he has had baseline swelling to humerus and forearm since surgery. Noted left hand swelling since yesterday. He thinks he may have been bitten on the dorsum of his left hand by an insect. Complains of pain and swelling to his left hand. States his strength and sensation is returning to his left arm since his surgery. Denies recent fever, chills, chest pain, shortness of breath, abdominal pain, nausea, vomiting, diarrhea. Nothing makes his symptoms better or worse.   Past Medical History  Diagnosis Date  . Schizo-affective schizophrenia (HCC)   . Bipolar 1 disorder (HCC)   . Anxiety   . Drug abuse, cocaine type   . Drug abuse, marijuana     There are no active problems to display for this patient.   Past Surgical History  Procedure Laterality Date  . Hernia repair    . Humerus im nail Left 06/30/2015    Procedure: INTRAMEDULLARY (IM) NAIL HUMERAL;  Surgeon: Christena Flake, MD;  Location: [facility] ORS;  Service: Orthopedics;  Laterality: Left;    Current Outpatient Rx  Name  Route  Sig  Dispense  Refill  . divalproex (DEPAKOTE) 500 MG DR tablet   Oral   Take 500 mg by mouth 2 (two) times daily.         . fluPHENAZine (PROLIXIN) 10 MG tablet   Oral   Take 10 mg by mouth 2 (two) times daily.         . hydrOXYzine (VISTARIL) 25 MG capsule   Oral   Take 25 mg by mouth 3  (three) times daily as needed for anxiety.         Marland Kitchen ibuprofen (ADVIL,MOTRIN) 800 MG tablet   Oral   Take 1 tablet (800 mg total) by mouth every 8 (eight) hours as needed for moderate pain.   15 tablet   0   . LORazepam (ATIVAN) 0.5 MG tablet   Oral   Take 0.5 mg by mouth 3 times/day as needed-between meals & bedtime for anxiety.         Marland Kitchen oxyCODONE (ROXICODONE) 5 MG immediate release tablet   Oral   Take 1-2 tablets (5-10 mg total) by mouth every 3 (three) hours as needed for severe pain.   60 tablet   0   . Paliperidone Palmitate (INVEGA TRINZA) 819 MG/2.625ML SUSP   Intramuscular   Inject 1 Dose into the muscle every 3 (three) months.         . QUEtiapine (SEROQUEL) 200 MG tablet   Oral   Take 200 mg by mouth at bedtime.         . traZODone (DESYREL) 150 MG tablet   Oral   Take 150 mg by mouth at bedtime as needed for sleep.           Allergies Review of patient's allergies indicates no known allergies.  No family history  on file.  Social History Social History  Substance Use Topics  . Smoking status: Current Every Day Smoker -- 0.50 packs/day    Types: Cigarettes  . Smokeless tobacco: Never Used  . Alcohol Use: No    Review of Systems  Constitutional: No fever/chills. Eyes: No visual changes. ENT: No sore throat. Cardiovascular: Denies chest pain. Respiratory: Denies shortness of breath. Gastrointestinal: No abdominal pain.  No nausea, no vomiting.  No diarrhea.  No constipation. Genitourinary: Negative for dysuria. Musculoskeletal: Positive for left hand pain and swelling. Negative for back pain. Skin: Negative for rash. Neurological: Negative for headaches, focal weakness or numbness.  10-point ROS otherwise negative.  ____________________________________________   PHYSICAL EXAM:  VITAL SIGNS: ED Triage Vitals  Enc Vitals Group     BP 07/13/15 2236 143/67 mmHg     Pulse Rate 07/13/15 2236 90     Resp 07/13/15 2236 18     Temp  07/13/15 2236 98 F (36.7 C)     Temp Source 07/13/15 2236 Oral     SpO2 07/13/15 2236 98 %     Weight 07/13/15 2236 193 lb (87.544 kg)     Height 07/13/15 2236 5\' 9"  (1.753 m)     Head Cir --      Peak Flow --      Pain Score 07/13/15 2237 5     Pain Loc --      Pain Edu? --      Excl. in GC? --     Constitutional: Alert and oriented. Well appearing and in no acute distress. Eyes: Conjunctivae are normal. PERRL. EOMI. Head: Atraumatic. Nose: No congestion/rhinnorhea. Mouth/Throat: Mucous membranes are moist.  Oropharynx non-erythematous. Neck: No stridor.   Cardiovascular: Normal rate, regular rhythm. Grossly normal heart sounds.  Good peripheral circulation. Respiratory: Normal respiratory effort.  No retractions. Lungs CTAB. Gastrointestinal: Soft and nontender. No distention. No abdominal bruits. No CVA tenderness. Musculoskeletal:  Left humerus in orthopedic brace. No significant swelling to humerus or forearm. Left palm is dry and flaky compared to the right palm. Dorsum of left hand and fingers with moderate swelling. Area is not tense. There is no warmth or erythema. There is no fluctuance or induration. Patient is able to fully open and close fist without difficulty. 2+ radial pulses. Brisk, less than 5 second capillary refill. Neurologic:  Normal speech and language. No gross focal neurologic deficits are appreciated. No gait instability. Skin:  Skin is warm, dry and intact. No rash noted. Psychiatric: Mood and affect are normal. Speech and behavior are normal.  ____________________________________________   LABS (all labs ordered are listed, but only abnormal results are displayed)  Labs Reviewed - No data to display ___________________________________  EKG None  Radiology Left hand complete (viewed by me, interpreted per Dr. Cherly Hensenhang): No evidence of fracture or dislocation. Diffuse soft tissue swelling about the hand, most prominent  dorsally.   ____________________   PROCEDURES  Procedure(s) performed: None  Critical Care performed: No  ____________________________________________   INITIAL IMPRESSION / ASSESSMENT AND PLAN / ED COURSE  Pertinent labs & imaging results that were available during my care of the patient were reviewed by me and considered in my medical decision making (see chart for details)  45 year old male who presents 2 weeks s/p left humerus surgery with isolated left hand swelling. Low suspicion for DVT or compartment syndrome. There is no evidence of infection. Patient has symmetrically diffuse edema to the dorsal aspect of his left hand. X-ray imaging studies  are negative. Possible that patient was bitten by insect yesterday which caused the swelling. Will apply compressive dressing, advised ice and elevation, and close follow-up with orthopedics. Strict return precautions given. Patient and caregiver verbalize understanding and agree with plan of care.  ________________________________________________________________   FINAL CLINICAL IMPRESSION(S) / ED DIAGNOSES  Final diagnoses:  Swelling of left hand      NEW MEDICATIONS STARTED DURING THIS VISIT:  New Prescriptions   No medications on file     Note:  This document was prepared using Dragon voice recognition software and may include unintentional dictation errors.    Irean Hong, MD 07/14/15 [phone]

## 2015-07-14 NOTE — Discharge Instructions (Signed)
1. Elevate affected area as much as possible to reduce swelling. 2. You may remove ace wrap to bathe and sleep. 3. Return to the ER for worsening symptoms, numbness/tingling, persistent vomiting or other concerns.  Edema Edema is an abnormal buildup of fluids in your bodytissues. Edema is somewhatdependent on gravity to pull the fluid to the lowest place in your body. That makes the condition more common in the legs and thighs (lower extremities). Painless swelling of the feet and ankles is common and becomes more likely as you get older. It is also common in looser tissues, like around your eyes.  When the affected area is squeezed, the fluid may move out of that spot and leave a dent for a few moments. This dent is called pitting.  CAUSES  There are many possible causes of edema. Eating too much salt and being on your feet or sitting for a long time can cause edema in your legs and ankles. Hot weather may make edema worse. Common medical causes of edema include:  Heart failure.  Liver disease.  Kidney disease.  Weak blood vessels in your legs.  Cancer.  An injury.  Pregnancy.  Some medications.  Obesity. SYMPTOMS  Edema is usually painless.Your skin may look swollen or shiny.  DIAGNOSIS  Your health care provider may be able to diagnose edema by asking about your medical history and doing a physical exam. You may need to have tests such as X-rays, an electrocardiogram, or blood tests to check for medical conditions that may cause edema.  TREATMENT  Edema treatment depends on the cause. If you have heart, liver, or kidney disease, you need the treatment appropriate for these conditions. General treatment may include:  Elevation of the affected body part above the level of your heart.  Compression of the affected body part. Pressure from elastic bandages or support stockings squeezes the tissues and forces fluid back into the blood vessels. This keeps fluid from entering the  tissues.  Restriction of fluid and salt intake.  Use of a water pill (diuretic). These medications are appropriate only for some types of edema. They pull fluid out of your body and make you urinate more often. This gets rid of fluid and reduces swelling, but diuretics can have side effects. Only use diuretics as directed by your health care provider. HOME CARE INSTRUCTIONS   Keep the affected body part above the level of your heart when you are lying down.   Do not sit still or stand for prolonged periods.   Do not put anything directly under your knees when lying down.  Do not wear constricting clothing or garters on your upper legs.   Exercise your legs to work the fluid back into your blood vessels. This may help the swelling go down.   Wear elastic bandages or support stockings to reduce ankle swelling as directed by your health care provider.   Eat a low-salt diet to reduce fluid if your health care provider recommends it.   Only take medicines as directed by your health care provider. SEEK MEDICAL CARE IF:   Your edema is not responding to treatment.  You have heart, liver, or kidney disease and notice symptoms of edema.  You have edema in your legs that does not improve after elevating them.   You have sudden and unexplained weight gain. SEEK IMMEDIATE MEDICAL CARE IF:   You develop shortness of breath or chest pain.   You cannot breathe when you lie down.  You  develop pain, redness, or warmth in the swollen areas.   You have heart, liver, or kidney disease and suddenly get edema.  You have a fever and your symptoms suddenly get worse. MAKE SURE YOU:   Understand these instructions.  Will watch your condition.  Will get help right away if you are not doing well or get worse.   This information is not intended to replace advice given to you by your health care provider. Make sure you discuss any questions you have with your health care provider.     Document Released: 01/10/2005 Document Revised: 01/31/2014 Document Reviewed: 11/02/2012 Elsevier Interactive Patient Education [employer]

## 2015-07-16 ENCOUNTER — Ambulatory Visit: Payer: Medicaid Other | Attending: Student | Admitting: Occupational Therapy

## 2015-07-16 DIAGNOSIS — M25632 Stiffness of left wrist, not elsewhere classified: Secondary | ICD-10-CM | POA: Diagnosis present

## 2015-07-16 DIAGNOSIS — M25642 Stiffness of left hand, not elsewhere classified: Secondary | ICD-10-CM

## 2015-07-16 DIAGNOSIS — M6281 Muscle weakness (generalized): Secondary | ICD-10-CM | POA: Diagnosis present

## 2015-07-16 NOTE — Therapy (Signed)
[facility] [address] , [place], [address] Phone: [phone]   Fax:  [phone]  Occupational Therapy Treatment  Patient Details  Name: Kevin Dunlap MRN: [id number] Date of Birth: 13-Oct-1970 Referring Provider: Marney DoctorMcGhee  Encounter Date: 07/16/2015      OT End of Session - 07/16/15 2017    Visit Number 1   Number of Visits 1   Date for OT Re-Evaluation 07/16/15   OT Start Time 1100   OT Stop Time 1147   OT Time Calculation (min) 47 min   Activity Tolerance Patient tolerated treatment well   Behavior During Therapy [facility] for tasks assessed/performed      Past Medical History  Diagnosis Date  . Schizo-affective schizophrenia (HCC)   . Bipolar 1 disorder (HCC)   . Anxiety   . Drug abuse, cocaine type   . Drug abuse, marijuana     Past Surgical History  Procedure Laterality Date  . Hernia repair    . Humerus im nail Left 06/30/2015    Procedure: INTRAMEDULLARY (IM) NAIL HUMERAL;  Surgeon: Christena FlakeJohn J Poggi, MD;  Location: [facility] ORS;  Service: Orthopedics;  Laterality: Left;    There were no vitals filed for this visit.      Subjective Assessment - 07/16/15 2009    Subjective  Pt in need for splint to keep wrist up - had some nerve damage with the fracture at humerus- cannot grip anything with my L hand    Patient Stated Goals Need splint for my wrist - my hand is swollen    Currently in Pain? Yes   Pain Score 4    Pain Location Hand   Pain Orientation Left   Pain Descriptors / Indicators Aching   Pain Type Surgical pain   Pain Onset 1 to 4 weeks ago            [facility] - 07/16/15 0001    Assessment   Diagnosis L humerus shaft fx -ORIF- radial N injury    Referring Provider McGhee   Onset Date 06/30/15   Precautions   Required Braces or Orthoses Other Brace/Splint   Other Brace/Splint Elbow splint    Balance Screen   Has the patient fallen in the past 6 months Yes   How many  times? 1   Has the patient had a decrease in activity level because of a fear of falling?  No   Is the patient reluctant to leave their home because of a fear of falling?  No   Home  Environment   Family/patient expects to be discharged to: Group home   Prior Function   Leisure R hand dominant - do own ADL's         See pt instruction                  OT Education - 07/16/15 2015    Education provided Yes   Education Details splint wearing and HEP   Person(s) Educated Patient;Caregiver(s)   Methods Verbal cues;Tactile cues;Demonstration;Explanation;Handout   Comprehension Verbalized understanding;Returned demonstration;Verbal cues required             OT Long Term Goals - 07/16/15 2020    OT LONG TERM GOAL #1   Title Pt and caretaker to verbalize understanding of homeprogram    Status Achieved               Plan - 07/16/15 2017    Clinical Impression  Statement Pt present about 2 and 1/2 wks from ORIF for L humerus shaft rx with radial N involvement - pt in need for custom wrist splint - was fabricated and fit - but had to do forearm  shorter because of elbow splint - pt and caretaker ed on splint wearing and precautions - pt also  showed sever edema in hand - and fitted with isotoner glove and  showed ptand caretaker from group home HEP for AAROM , PROM  for wrist to  digtis     Rehab Potential Good   OT Frequency One time visit   OT Treatment/Interventions Splinting;Patient/family education   Plan Caretaker to phone if any issues with splint    OT Home Exercise Plan see pt instructions    Consulted and Agree with Plan of Care Patient;Family member/caregiver      Patient will benefit from skilled therapeutic intervention in order to improve the following deficits and impairments:  Impaired flexibility, Increased edema  Visit Diagnosis: Stiffness of left wrist, not elsewhere classified - Plan: Ot plan of care cert/re-cert  Stiffness of left hand,  not elsewhere classified - Plan: Ot plan of care cert/re-cert  Muscle weakness (generalized) - Plan: Ot plan of care cert/re-cert      G-Codes - 07/16/15 2022    Functional Assessment Tool Used edema, ROM , strength , splint evaluation and fabrication    Functional Limitation Self care   Self Care Current Status ([id number]) At least 80 percent but less than 100 percent impaired, limited or restricted   Self Care Goal Status ([id number]) At least 80 percent but less than 100 percent impaired, limited or restricted   Self Care Discharge Status [phone]([id number]) At least 80 percent but less than 100 percent impaired, limited or restricted      Problem List There are no active problems to display for this patient.   Oletta CohnuPreez, Mikelle Myrick OTR/L,CLT 07/16/2015, 8:25 PM  [facility] [address] [place], [place], [address] Phone: [phone]   Fax:  [phone]  Name: Kevin Dunlap MRN: [id number] Date of Birth: 02-14-1970

## 2015-07-16 NOTE — Patient Instructions (Signed)
Pt was fabricated forearm base wrist splint to prevent over stretching of extensors  Pt was fitted with isotoner glove because of edema  And pt and caretaker ed on HEP for splnt wearing and precautions  AROM /AAROM and PROM for sup to neutral, wrist extention, thumb flexion and abd Digits fisting and extention  3 x day  Splint wear most all the time when up and about - or sleeping

## 2016-11-04 IMAGING — CR DG HUMERUS 2V *L*
7 of 8 series · 7 of 8 positions shown · non-contrast
Comparison: 06/21/2015

CLINICAL DATA: Left humerus open reduction internal fixation

EXAM:
DG C-ARM 61-120 MIN; LEFT HUMERUS - 2+ VIEW

[p2]
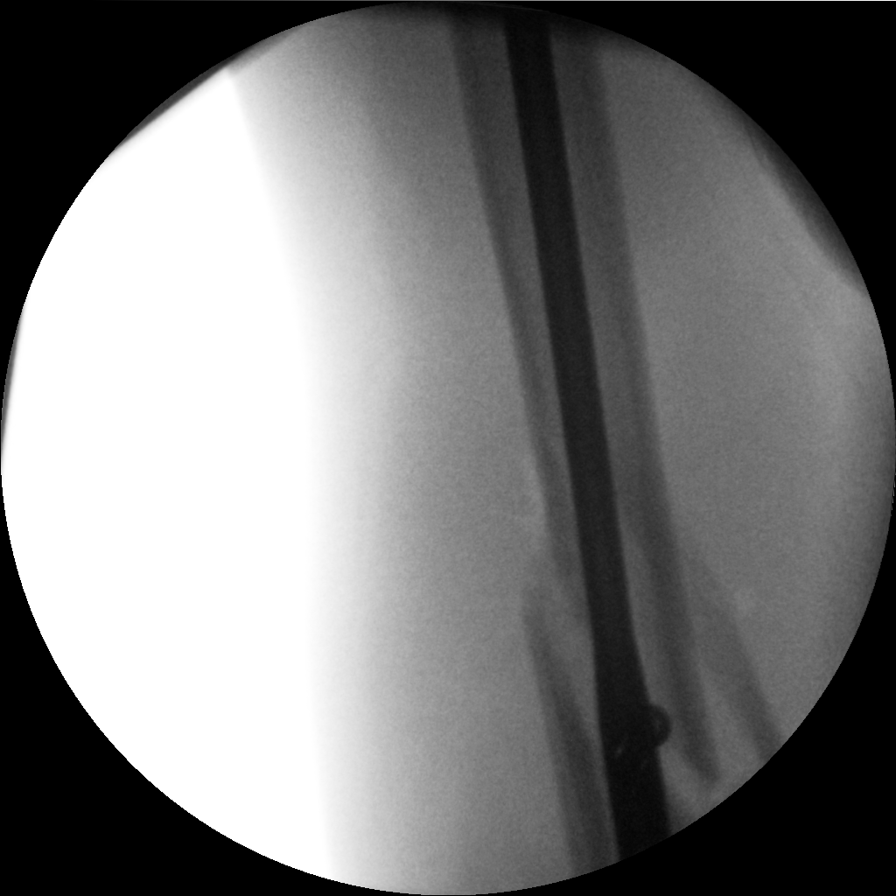

[p3]
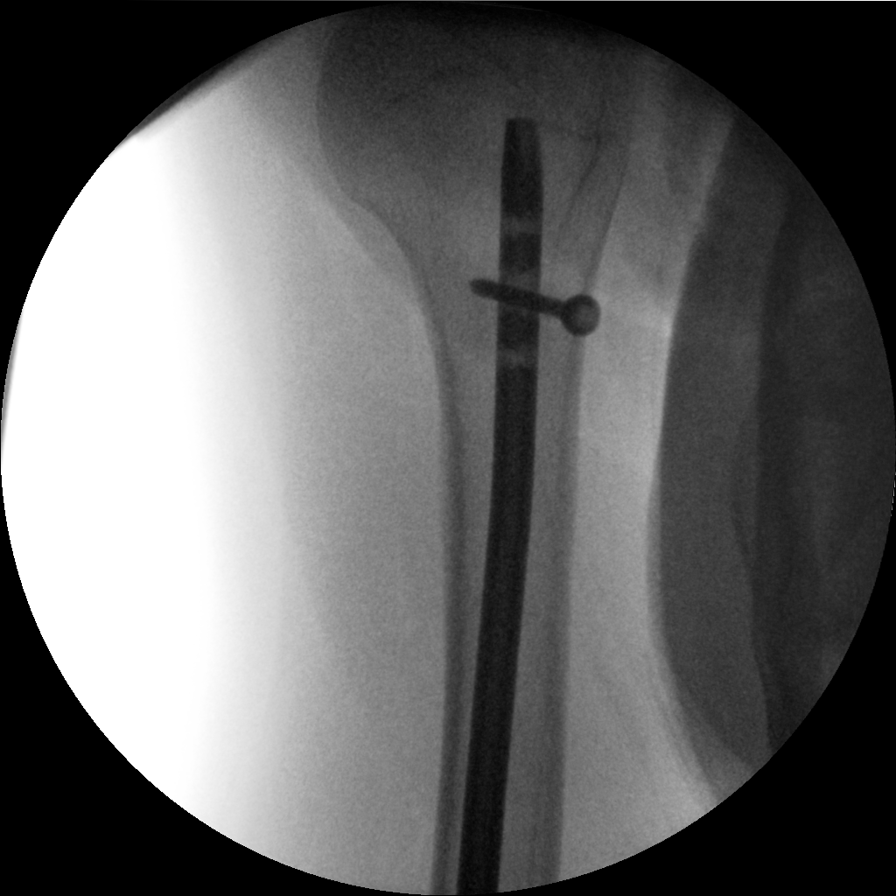

[p4]
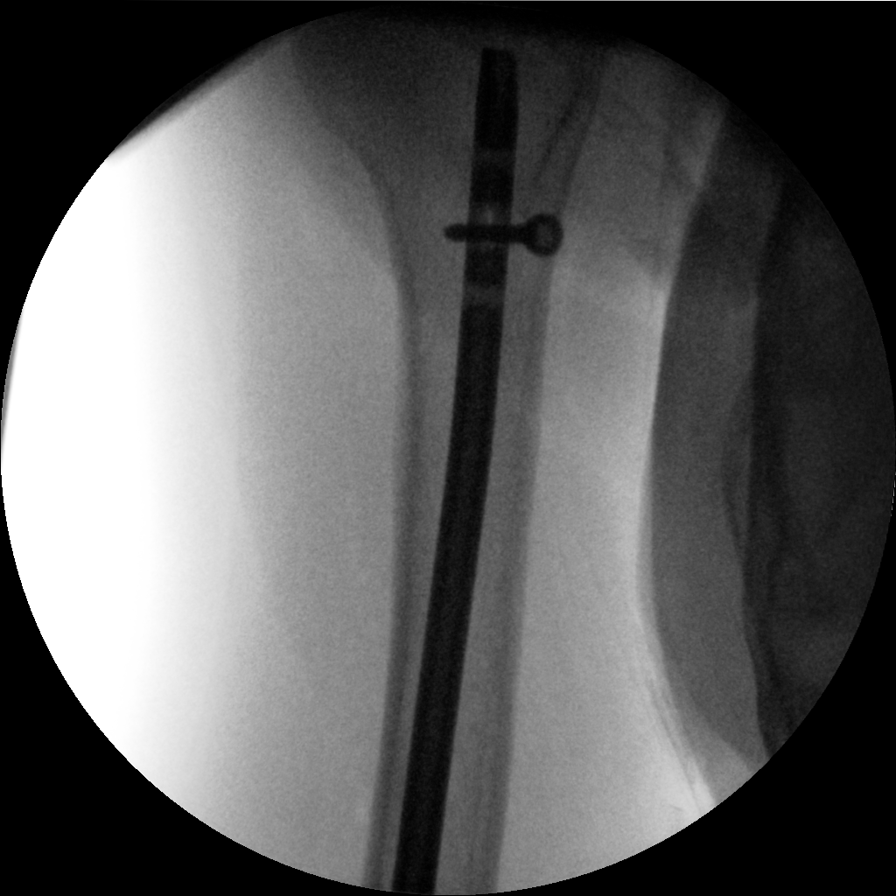

[p5]
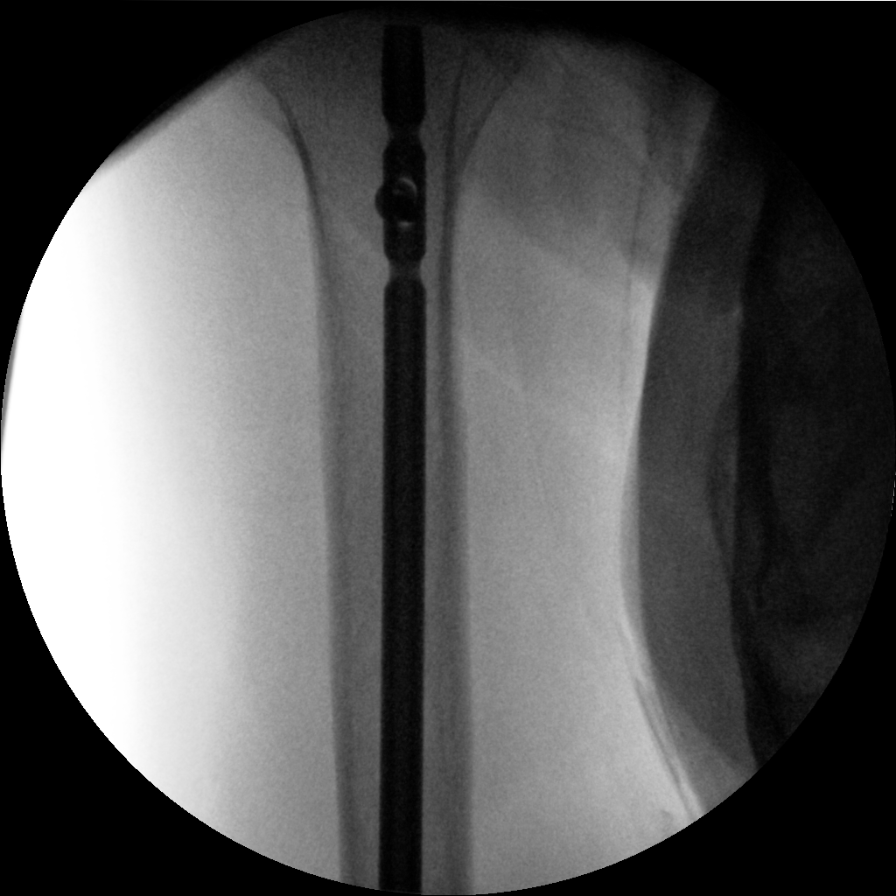

[p6]
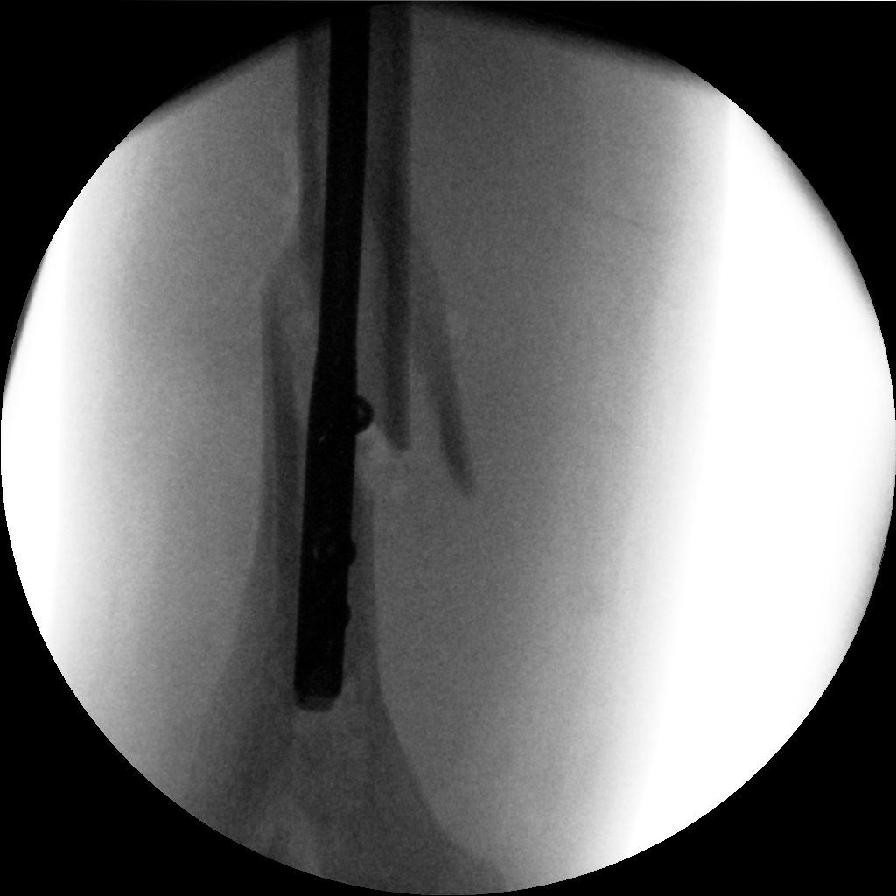

[p7]
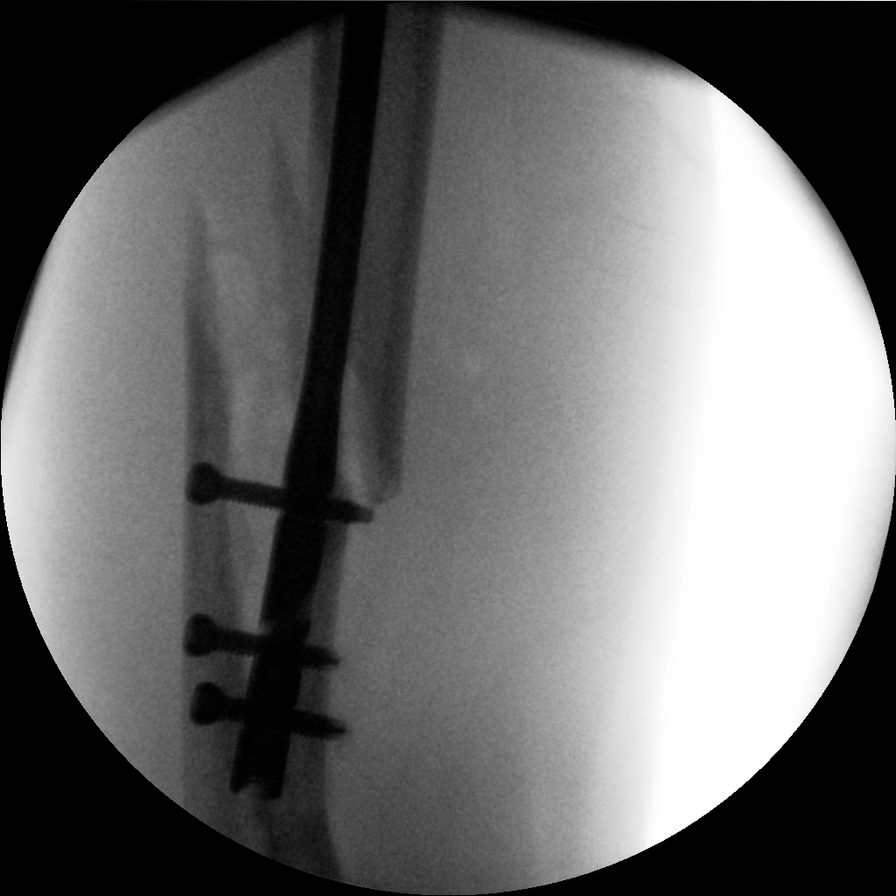

[p8]
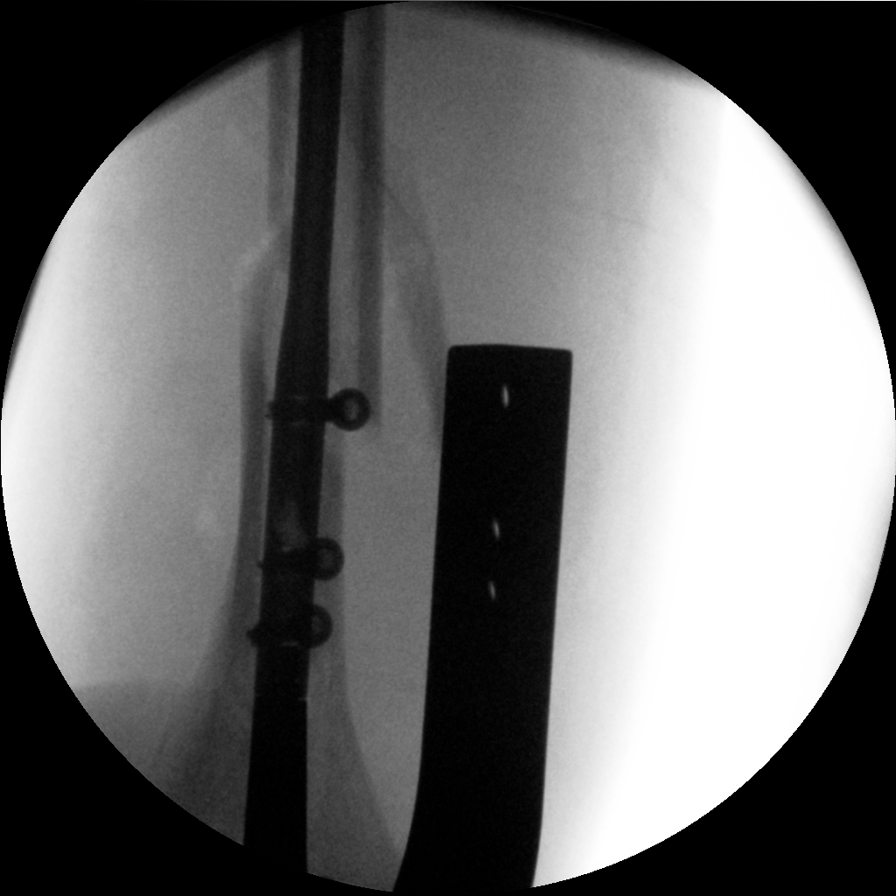

[7 of 8 positions shown; findings below may reference images not displayed]

FINDINGS: Eight views of the left humerus submitted. The patient is status
post open reduction internal fixation of comminuted displaced
fracture displaced fracture distal shaft of the left humerus. There
is intra medullary rod and metallic fixation screws are noted at the
fracture site. There is improvement near anatomic alignment.
Persistent mild displacement of some comminuted fragments.
Fluoroscopy time was 2 minutes 50 seconds. Please see the operative
report.
IMPRESSION: The patient is status post open reduction internal fixation of
comminuted displaced fracture displaced fracture distal shaft of the
left humerus. There is intra medullary rod and metallic fixation
screws are noted at the fracture site. There is improvement near
anatomic alignment. Persistent mild displacement of some comminuted
fragments. Fluoroscopy time was 2 minutes 50 seconds. Please see the
operative report.

## 2016-11-18 IMAGING — DX DG HAND COMPLETE 3+V*L*
3 series · 3 of 3 positions shown · non-contrast
Comparison: None.

CLINICAL DATA: Acute onset of left hand swelling. Initial
encounter.

EXAM:
LEFT HAND - COMPLETE 3+ VIEW

[hand obl]
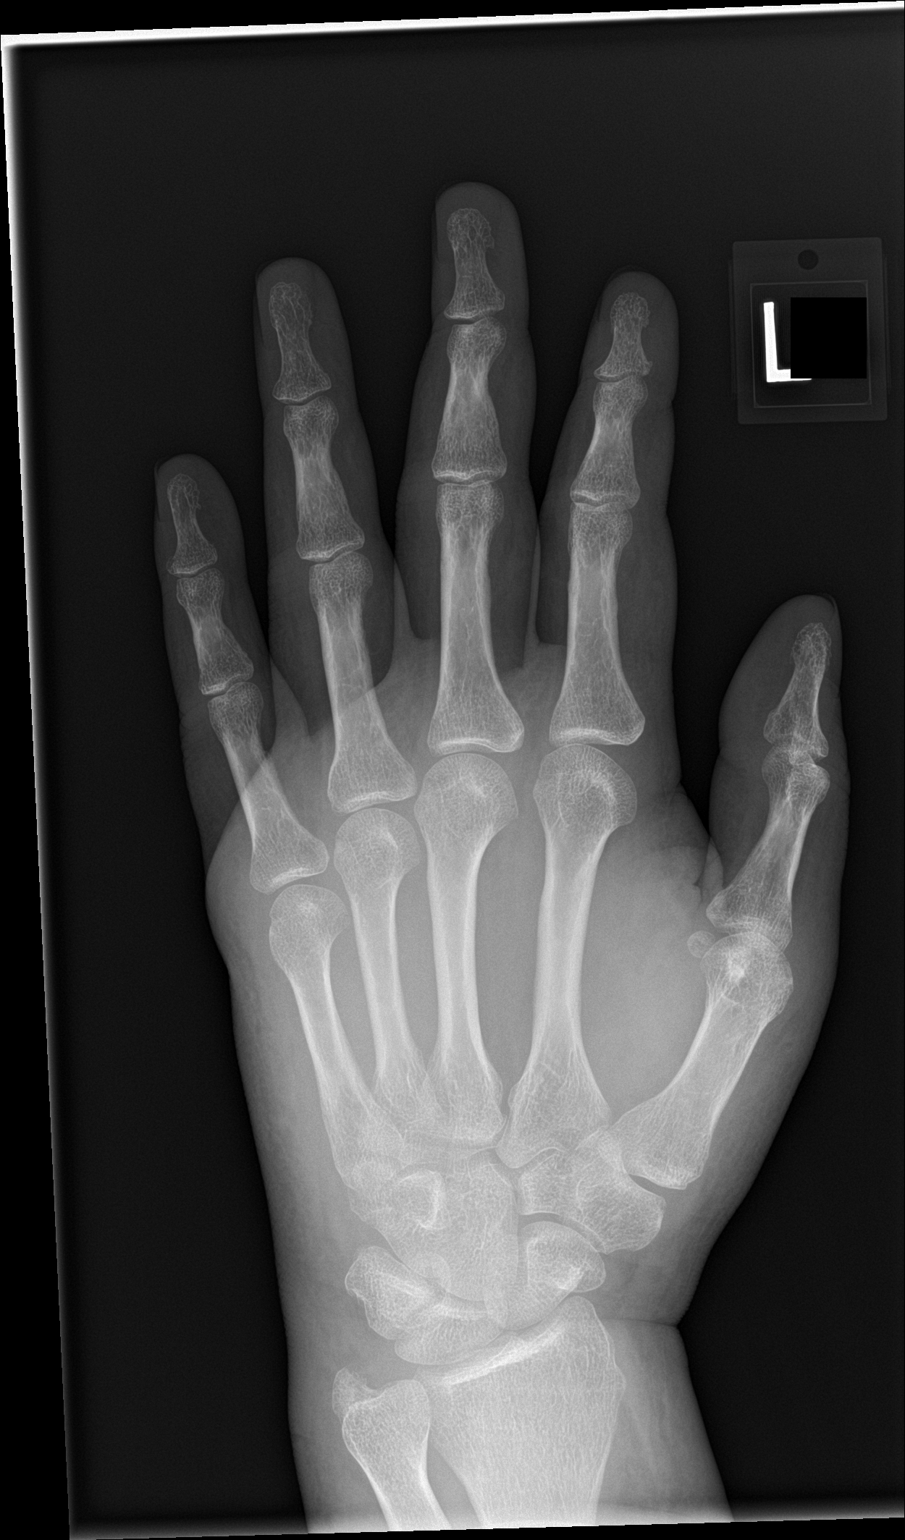

[hand lat]
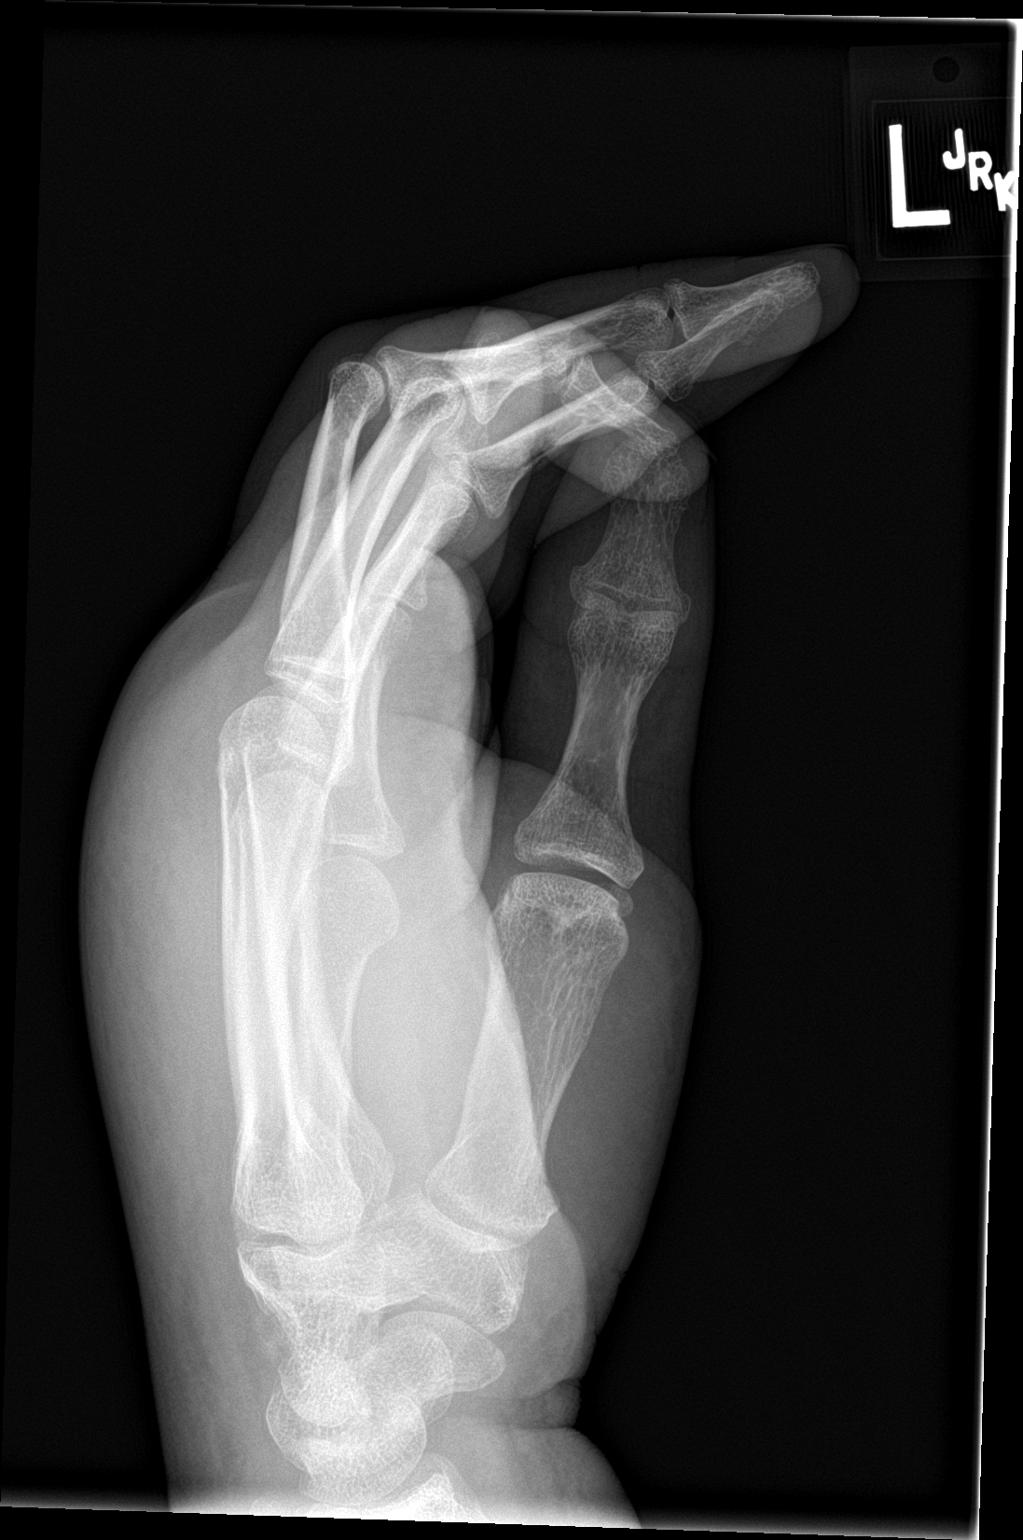

[hand ap]
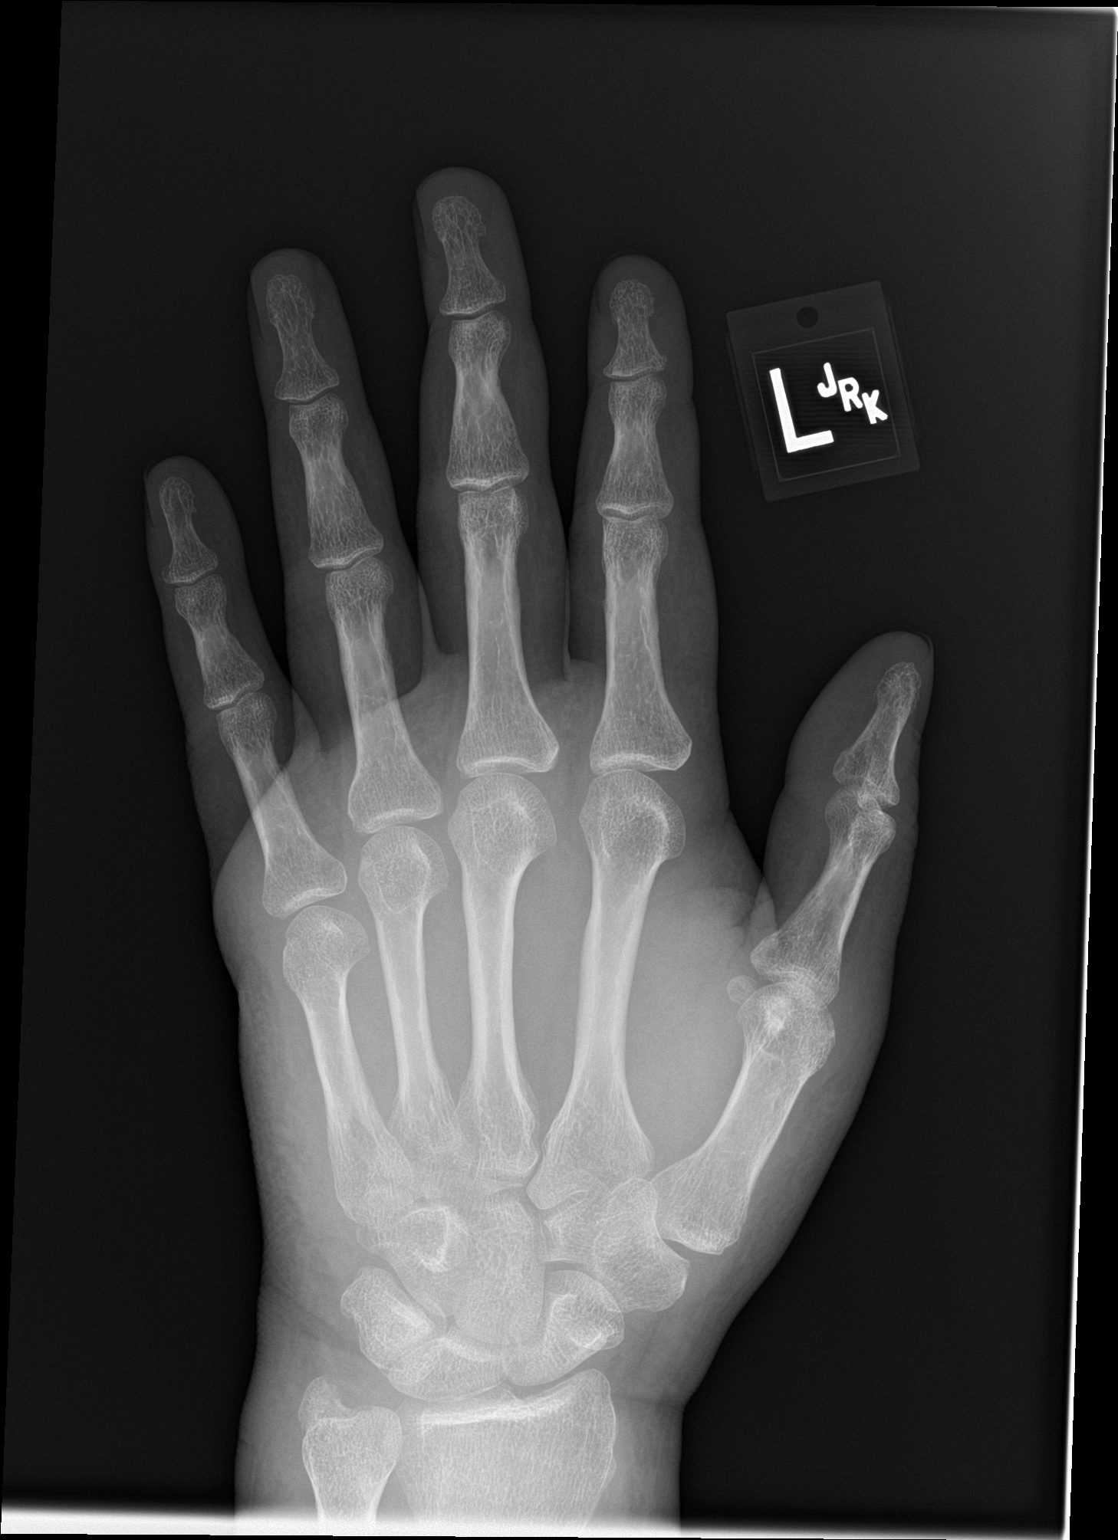

[3 of 3 positions shown; findings below may reference images not displayed]

FINDINGS: There is no evidence of fracture or dislocation. The joint spaces
are preserved. The carpal rows are intact, and demonstrate normal
alignment. Diffuse soft tissue swelling is noted about the hand,
most prominent dorsally. No radiopaque foreign bodies are seen.
IMPRESSION: No evidence of fracture or dislocation. Diffuse soft tissue swelling
about the hand, most prominent dorsally.

## 2017-03-28 ENCOUNTER — Ambulatory Visit (INDEPENDENT_AMBULATORY_CARE_PROVIDER_SITE_OTHER): Payer: Medicaid Other | Admitting: Urology

## 2017-03-28 ENCOUNTER — Encounter: Payer: Self-pay | Admitting: Urology

## 2017-03-28 VITALS — BP 124/86 | HR 90 | Resp 16 | Ht 72.0 in | Wt 206.0 lb

## 2017-03-28 DIAGNOSIS — R972 Elevated prostate specific antigen [PSA]: Secondary | ICD-10-CM

## 2017-03-28 NOTE — Progress Notes (Signed)
03/28/2017 2:43 PM   Kevin Dunlap Nov 01, 1970 [id number]  Referring provider: Orene Desanctis, MD [address] [place], [place] [address]  Chief Complaint  Patient presents with  . Elevated PSA    HPI: 47 year old male referred for further evaluation of elevated PSA.  PSA screening initiated in 2015 by PCP.  PSA trend as below.  More recently, he has had a slight rise in his PSA but is essentially stable for the past 4 years.  He reports dysuria off and on occasionally.  He denies urgency or frequency.  He occasionally has post void dribbling.  No gross hematuria.  He is not bothered by his urinary symptoms.  He is somewhat of a difficult historian.  No family history of prostate cancer.  No weight loss or bone pain.  He does have significant mental health comorbidities and does have a healthcare power of attorney.  He is accompanied to the office today by caretaker.  PSA trend: 2.34 on 09/2013 1.97 on 10/2014 2.66 on 12/2016 2.52 on 03/14/2017 2.57 on 03/14/2017   PMH: Past Medical History:  Diagnosis Date  . Anxiety   . Bipolar 1 disorder (HCC)   . Drug abuse, cocaine type (HCC)   . Drug abuse, marijuana   . Schizo-affective schizophrenia [facility])     Surgical History: Past Surgical History:  Procedure Laterality Date  . HERNIA REPAIR    . HUMERUS IM NAIL Left 06/30/2015   Procedure: INTRAMEDULLARY (IM) NAIL HUMERAL;  Surgeon: Christena Flake, MD;  Location: [facility] ORS;  Service: Orthopedics;  Laterality: Left;    Home Medications:  Allergies as of 03/28/2017   No Known Allergies     Medication List        Accurate as of 03/28/17  2:43 PM. Always use your most recent med list.          benztropine 1 MG tablet Commonly known as:  COGENTIN Take 1 mg by mouth 2 (two) times daily.   divalproex 500 MG DR tablet Commonly known as:  DEPAKOTE Take 500 mg by mouth 2 (two) times daily.   doxepin 25 MG capsule Commonly known as:  SINEQUAN Take by mouth.   fluPHENAZine  10 MG tablet Commonly known as:  PROLIXIN Take 10 mg by mouth 2 (two) times daily.   hydrOXYzine 25 MG capsule Commonly known as:  VISTARIL Take 25 mg by mouth 3 (three) times daily as needed for anxiety.   ibuprofen 800 MG tablet Commonly known as:  ADVIL,MOTRIN Take 1 tablet (800 mg total) by mouth every 8 (eight) hours as needed for moderate pain.   INVEGA TRINZA 819 MG/2.625ML Susp Generic drug:  Paliperidone Palmitate Inject 1 Dose into the muscle every 3 (three) months.   LORazepam 0.5 MG tablet Commonly known as:  ATIVAN Take 0.5 mg by mouth 3 times/day as needed-between meals & bedtime for anxiety.   PROVENTIL HFA 108 (90 Base) MCG/ACT inhaler Generic drug:  albuterol Inhale into the lungs.   QUEtiapine 200 MG tablet Commonly known as:  SEROQUEL Take 200 mg by mouth at bedtime.   traZODone 150 MG tablet Commonly known as:  DESYREL Take 150 mg by mouth at bedtime as needed for sleep.       Allergies: No Known Allergies  Family History: Family History  Problem Relation Age of Onset  . Prostate cancer Neg Hx   . Kidney cancer Neg Hx   . Bladder Cancer Neg Hx   . Kidney Stones Neg Hx  Social History:  reports that he has been smoking cigarettes.  He has been smoking about 0.50 packs per day. he has never used smokeless tobacco. He reports that he uses drugs. Drugs: Cocaine and Marijuana. He reports that he does not drink alcohol.  ROS: UROLOGY Frequent Urination?: Yes Hard to postpone urination?: No Burning/pain with urination?: No Get up at night to urinate?: No Leakage of urine?: Yes Urine stream starts and stops?: Yes Trouble starting stream?: Yes Do you have to strain to urinate?: Yes Blood in urine?: No Urinary tract infection?: No Sexually transmitted disease?: No Injury to kidneys or bladder?: No Painful intercourse?: No Weak stream?: No Erection problems?: No Penile pain?: Yes  Gastrointestinal Nausea?: No Vomiting?:  No Indigestion/heartburn?: No Diarrhea?: No Constipation?: No  Constitutional Fever: No Night sweats?: No Weight loss?: No Fatigue?: No  Skin Skin rash/lesions?: No Itching?: No  Eyes Blurred vision?: No Double vision?: No  Ears/Nose/Throat Sore throat?: No Sinus problems?: No  Hematologic/Lymphatic Swollen glands?: No Easy bruising?: No  Cardiovascular Leg swelling?: No Chest pain?: No  Respiratory Cough?: Yes Shortness of breath?: Yes  Endocrine Excessive thirst?: No  Musculoskeletal Back pain?: Yes Joint pain?: No  Neurological Headaches?: No Dizziness?: Yes  Psychologic Depression?: Yes Anxiety?: Yes  Physical Exam: BP 124/86   Pulse 90   Resp 16   Ht 6' (1.829 m)   Wt 206 lb (93.4 kg)   SpO2 98%   BMI 27.94 kg/m   Constitutional:  Alert and oriented, No acute distress. HEENT: [place] AT, moist mucus membranes.  Trachea midline, no masses. Cardiovascular: No clubbing, cyanosis, or edema. Respiratory: Normal respiratory effort, no increased work of breathing. GI: Abdomen is soft, nontender, nondistended, no abdominal masses GU: No CVA tenderness.  Rectal: Normal sphincter tone.  40+ cc prostate, nontender, no nodules. Skin: No rashes, bruises or suspicious lesions. Neurologic: Grossly intact, no focal deficits, moving all 4 extremities. Psychiatric: Normal mood and affect.  Laboratory Data: Cr 0.8 on 11/2015  PSA trend as above  Urinalysis n/a  Pertinent Imaging: n/a  Assessment & Plan:    1. Elevated PSA  We reviewed the implications of an elevated PSA and the uncertainty surrounding it. In general, a man's PSA increases with age and is produced by both normal and cancerous prostate tissue. Differential for elevated PSA is BPH, prostate cancer, infection, recent intercourse/ejaculation, prostate infarction, recent urethroscopic manipulation (foley placement/cystoscopy) and prostatitis. Management of an elevated PSA can include  observation or prostate biopsy and wediscussed this in detail. We discussed that indications for prostate biopsy are defined by age and race specific PSA cutoffs as well as a PSA velocity of 0.75/year.  Based on review of his numerous previous PSAs, is been essentially stable for the past 4 years with variation allowance for the lab error itself.  Additionally, his rectal exam is unremarkable, albeit enlarged.  As such, his PSA density is likely appropriate.  I would like him to continue to follow-up with his PCP with annual PSA checks.  If his PSA rises above 3, refer back to Urology.  Vanna Scotland, MD  [facility] [address], [address] [place], [place] [address] [phone]

## 2023-07-24 ENCOUNTER — Telehealth: Payer: Self-pay

## 2023-07-24 NOTE — Telephone Encounter (Signed)
 Pt not available to take call to schedule his colonoscopy.  He has a legal guardian.  Last Guardianship form was dated 03/28/2017 listed Milderd Snide as Art therapist.  He resides at [facility].  Jaime from patient's group home stated that he would fax me Guardianship forms, but there is no DPR.  Thanks,  Kramer, CMA
